# Patient Record
Sex: Male | Born: 1935
Health system: Southern US, Community
[De-identification: ages and names within clinical notes are randomized; demographics above are authoritative.]

## PROBLEM LIST (undated history)

## (undated) DIAGNOSIS — R011 Cardiac murmur, unspecified: Secondary | ICD-10-CM

## (undated) DIAGNOSIS — G2581 Restless legs syndrome: Secondary | ICD-10-CM

## (undated) DIAGNOSIS — H9190 Unspecified hearing loss, unspecified ear: Secondary | ICD-10-CM

## (undated) DIAGNOSIS — I219 Acute myocardial infarction, unspecified: Secondary | ICD-10-CM

## (undated) DIAGNOSIS — R350 Frequency of micturition: Secondary | ICD-10-CM

## (undated) DIAGNOSIS — R51 Headache: Secondary | ICD-10-CM

## (undated) DIAGNOSIS — R519 Headache, unspecified: Secondary | ICD-10-CM

## (undated) DIAGNOSIS — E785 Hyperlipidemia, unspecified: Secondary | ICD-10-CM

## (undated) HISTORY — DX: Unspecified hearing loss, unspecified ear: H91.90

## (undated) HISTORY — DX: Frequency of micturition: R35.0

## (undated) HISTORY — DX: Cardiac murmur, unspecified: R01.1

## (undated) HISTORY — DX: Acute myocardial infarction, unspecified: I21.9

## (undated) HISTORY — DX: Headache: R51

## (undated) HISTORY — DX: Restless legs syndrome: G25.81

## (undated) HISTORY — DX: Headache, unspecified: R51.9

---

## 2014-04-02 HISTORY — PX: CARPAL TUNNEL RELEASE: SHX101

## 2014-04-23 DIAGNOSIS — M542 Cervicalgia: Secondary | ICD-10-CM | POA: Diagnosis not present

## 2014-04-23 DIAGNOSIS — M5413 Radiculopathy, cervicothoracic region: Secondary | ICD-10-CM | POA: Diagnosis not present

## 2014-04-23 DIAGNOSIS — M546 Pain in thoracic spine: Secondary | ICD-10-CM | POA: Diagnosis not present

## 2014-04-23 DIAGNOSIS — M545 Low back pain: Secondary | ICD-10-CM | POA: Diagnosis not present

## 2014-04-30 DIAGNOSIS — M542 Cervicalgia: Secondary | ICD-10-CM | POA: Diagnosis not present

## 2014-04-30 DIAGNOSIS — M545 Low back pain: Secondary | ICD-10-CM | POA: Diagnosis not present

## 2014-04-30 DIAGNOSIS — M5413 Radiculopathy, cervicothoracic region: Secondary | ICD-10-CM | POA: Diagnosis not present

## 2014-04-30 DIAGNOSIS — M546 Pain in thoracic spine: Secondary | ICD-10-CM | POA: Diagnosis not present

## 2014-05-14 DIAGNOSIS — M545 Low back pain: Secondary | ICD-10-CM | POA: Diagnosis not present

## 2014-05-14 DIAGNOSIS — M542 Cervicalgia: Secondary | ICD-10-CM | POA: Diagnosis not present

## 2014-05-14 DIAGNOSIS — M546 Pain in thoracic spine: Secondary | ICD-10-CM | POA: Diagnosis not present

## 2014-05-14 DIAGNOSIS — M5413 Radiculopathy, cervicothoracic region: Secondary | ICD-10-CM | POA: Diagnosis not present

## 2014-06-10 DIAGNOSIS — M542 Cervicalgia: Secondary | ICD-10-CM | POA: Diagnosis not present

## 2014-06-10 DIAGNOSIS — M546 Pain in thoracic spine: Secondary | ICD-10-CM | POA: Diagnosis not present

## 2014-06-10 DIAGNOSIS — M545 Low back pain: Secondary | ICD-10-CM | POA: Diagnosis not present

## 2014-06-10 DIAGNOSIS — M5413 Radiculopathy, cervicothoracic region: Secondary | ICD-10-CM | POA: Diagnosis not present

## 2014-06-16 DIAGNOSIS — M545 Low back pain: Secondary | ICD-10-CM | POA: Diagnosis not present

## 2014-06-16 DIAGNOSIS — M546 Pain in thoracic spine: Secondary | ICD-10-CM | POA: Diagnosis not present

## 2014-06-16 DIAGNOSIS — M5413 Radiculopathy, cervicothoracic region: Secondary | ICD-10-CM | POA: Diagnosis not present

## 2014-06-16 DIAGNOSIS — M542 Cervicalgia: Secondary | ICD-10-CM | POA: Diagnosis not present

## 2014-06-21 DIAGNOSIS — S46011A Strain of muscle(s) and tendon(s) of the rotator cuff of right shoulder, initial encounter: Secondary | ICD-10-CM | POA: Diagnosis not present

## 2014-09-10 DIAGNOSIS — M9904 Segmental and somatic dysfunction of sacral region: Secondary | ICD-10-CM | POA: Diagnosis not present

## 2014-09-10 DIAGNOSIS — M9903 Segmental and somatic dysfunction of lumbar region: Secondary | ICD-10-CM | POA: Diagnosis not present

## 2014-09-10 DIAGNOSIS — S39012A Strain of muscle, fascia and tendon of lower back, initial encounter: Secondary | ICD-10-CM | POA: Diagnosis not present

## 2014-09-10 DIAGNOSIS — S338XXA Sprain of other parts of lumbar spine and pelvis, initial encounter: Secondary | ICD-10-CM | POA: Diagnosis not present

## 2014-09-13 DIAGNOSIS — M9903 Segmental and somatic dysfunction of lumbar region: Secondary | ICD-10-CM | POA: Diagnosis not present

## 2014-09-13 DIAGNOSIS — S338XXA Sprain of other parts of lumbar spine and pelvis, initial encounter: Secondary | ICD-10-CM | POA: Diagnosis not present

## 2014-09-13 DIAGNOSIS — S39012A Strain of muscle, fascia and tendon of lower back, initial encounter: Secondary | ICD-10-CM | POA: Diagnosis not present

## 2014-09-13 DIAGNOSIS — M9904 Segmental and somatic dysfunction of sacral region: Secondary | ICD-10-CM | POA: Diagnosis not present

## 2014-09-20 DIAGNOSIS — S338XXA Sprain of other parts of lumbar spine and pelvis, initial encounter: Secondary | ICD-10-CM | POA: Diagnosis not present

## 2014-09-20 DIAGNOSIS — M9904 Segmental and somatic dysfunction of sacral region: Secondary | ICD-10-CM | POA: Diagnosis not present

## 2014-09-20 DIAGNOSIS — S39012A Strain of muscle, fascia and tendon of lower back, initial encounter: Secondary | ICD-10-CM | POA: Diagnosis not present

## 2014-09-20 DIAGNOSIS — M9903 Segmental and somatic dysfunction of lumbar region: Secondary | ICD-10-CM | POA: Diagnosis not present

## 2015-01-12 DIAGNOSIS — Z23 Encounter for immunization: Secondary | ICD-10-CM | POA: Diagnosis not present

## 2015-02-03 DIAGNOSIS — M545 Low back pain: Secondary | ICD-10-CM | POA: Diagnosis not present

## 2015-02-03 DIAGNOSIS — M546 Pain in thoracic spine: Secondary | ICD-10-CM | POA: Diagnosis not present

## 2015-02-03 DIAGNOSIS — M5413 Radiculopathy, cervicothoracic region: Secondary | ICD-10-CM | POA: Diagnosis not present

## 2015-02-03 DIAGNOSIS — M9903 Segmental and somatic dysfunction of lumbar region: Secondary | ICD-10-CM | POA: Diagnosis not present

## 2015-02-03 DIAGNOSIS — M9901 Segmental and somatic dysfunction of cervical region: Secondary | ICD-10-CM | POA: Diagnosis not present

## 2015-02-03 DIAGNOSIS — M9902 Segmental and somatic dysfunction of thoracic region: Secondary | ICD-10-CM | POA: Diagnosis not present

## 2015-02-07 DIAGNOSIS — M545 Low back pain: Secondary | ICD-10-CM | POA: Diagnosis not present

## 2015-02-07 DIAGNOSIS — M5413 Radiculopathy, cervicothoracic region: Secondary | ICD-10-CM | POA: Diagnosis not present

## 2015-02-07 DIAGNOSIS — M9902 Segmental and somatic dysfunction of thoracic region: Secondary | ICD-10-CM | POA: Diagnosis not present

## 2015-02-07 DIAGNOSIS — M546 Pain in thoracic spine: Secondary | ICD-10-CM | POA: Diagnosis not present

## 2015-02-07 DIAGNOSIS — M9903 Segmental and somatic dysfunction of lumbar region: Secondary | ICD-10-CM | POA: Diagnosis not present

## 2015-02-07 DIAGNOSIS — M9901 Segmental and somatic dysfunction of cervical region: Secondary | ICD-10-CM | POA: Diagnosis not present

## 2015-02-09 DIAGNOSIS — M9903 Segmental and somatic dysfunction of lumbar region: Secondary | ICD-10-CM | POA: Diagnosis not present

## 2015-02-09 DIAGNOSIS — M545 Low back pain: Secondary | ICD-10-CM | POA: Diagnosis not present

## 2015-02-09 DIAGNOSIS — M5413 Radiculopathy, cervicothoracic region: Secondary | ICD-10-CM | POA: Diagnosis not present

## 2015-02-09 DIAGNOSIS — M9901 Segmental and somatic dysfunction of cervical region: Secondary | ICD-10-CM | POA: Diagnosis not present

## 2015-02-09 DIAGNOSIS — M546 Pain in thoracic spine: Secondary | ICD-10-CM | POA: Diagnosis not present

## 2015-02-09 DIAGNOSIS — M9902 Segmental and somatic dysfunction of thoracic region: Secondary | ICD-10-CM | POA: Diagnosis not present

## 2015-02-16 DIAGNOSIS — G5621 Lesion of ulnar nerve, right upper limb: Secondary | ICD-10-CM | POA: Diagnosis not present

## 2015-02-16 DIAGNOSIS — M25532 Pain in left wrist: Secondary | ICD-10-CM | POA: Diagnosis not present

## 2015-02-16 DIAGNOSIS — G5601 Carpal tunnel syndrome, right upper limb: Secondary | ICD-10-CM | POA: Diagnosis not present

## 2015-02-16 DIAGNOSIS — G5622 Lesion of ulnar nerve, left upper limb: Secondary | ICD-10-CM | POA: Diagnosis not present

## 2015-02-16 DIAGNOSIS — G5602 Carpal tunnel syndrome, left upper limb: Secondary | ICD-10-CM | POA: Diagnosis not present

## 2015-02-16 DIAGNOSIS — M25531 Pain in right wrist: Secondary | ICD-10-CM | POA: Diagnosis not present

## 2015-03-01 DIAGNOSIS — G5602 Carpal tunnel syndrome, left upper limb: Secondary | ICD-10-CM | POA: Diagnosis not present

## 2015-03-01 DIAGNOSIS — G5601 Carpal tunnel syndrome, right upper limb: Secondary | ICD-10-CM | POA: Diagnosis not present

## 2015-03-01 DIAGNOSIS — R202 Paresthesia of skin: Secondary | ICD-10-CM | POA: Diagnosis not present

## 2015-03-14 DIAGNOSIS — G5601 Carpal tunnel syndrome, right upper limb: Secondary | ICD-10-CM | POA: Diagnosis not present

## 2015-03-18 DIAGNOSIS — M79601 Pain in right arm: Secondary | ICD-10-CM | POA: Diagnosis not present

## 2015-03-18 DIAGNOSIS — Z01818 Encounter for other preprocedural examination: Secondary | ICD-10-CM | POA: Diagnosis not present

## 2015-03-29 DIAGNOSIS — G5601 Carpal tunnel syndrome, right upper limb: Secondary | ICD-10-CM | POA: Diagnosis not present

## 2015-03-29 DIAGNOSIS — M25531 Pain in right wrist: Secondary | ICD-10-CM | POA: Diagnosis not present

## 2015-04-11 DIAGNOSIS — G5601 Carpal tunnel syndrome, right upper limb: Secondary | ICD-10-CM | POA: Diagnosis not present

## 2015-05-06 DIAGNOSIS — J069 Acute upper respiratory infection, unspecified: Secondary | ICD-10-CM | POA: Diagnosis not present

## 2015-05-06 DIAGNOSIS — R197 Diarrhea, unspecified: Secondary | ICD-10-CM | POA: Diagnosis not present

## 2015-05-20 DIAGNOSIS — R197 Diarrhea, unspecified: Secondary | ICD-10-CM | POA: Diagnosis not present

## 2015-05-20 DIAGNOSIS — Z1211 Encounter for screening for malignant neoplasm of colon: Secondary | ICD-10-CM | POA: Diagnosis not present

## 2015-05-20 DIAGNOSIS — Z800 Family history of malignant neoplasm of digestive organs: Secondary | ICD-10-CM | POA: Diagnosis not present

## 2015-05-20 DIAGNOSIS — E782 Mixed hyperlipidemia: Secondary | ICD-10-CM | POA: Diagnosis not present

## 2015-05-20 DIAGNOSIS — I10 Essential (primary) hypertension: Secondary | ICD-10-CM | POA: Diagnosis not present

## 2015-05-20 DIAGNOSIS — J449 Chronic obstructive pulmonary disease, unspecified: Secondary | ICD-10-CM | POA: Diagnosis not present

## 2015-05-27 DIAGNOSIS — K5790 Diverticulosis of intestine, part unspecified, without perforation or abscess without bleeding: Secondary | ICD-10-CM | POA: Diagnosis not present

## 2015-05-27 DIAGNOSIS — I10 Essential (primary) hypertension: Secondary | ICD-10-CM | POA: Diagnosis not present

## 2015-05-27 DIAGNOSIS — Z800 Family history of malignant neoplasm of digestive organs: Secondary | ICD-10-CM | POA: Diagnosis not present

## 2015-05-27 DIAGNOSIS — K529 Noninfective gastroenteritis and colitis, unspecified: Secondary | ICD-10-CM | POA: Diagnosis not present

## 2015-05-27 DIAGNOSIS — K598 Other specified functional intestinal disorders: Secondary | ICD-10-CM | POA: Diagnosis not present

## 2015-05-27 DIAGNOSIS — K5730 Diverticulosis of large intestine without perforation or abscess without bleeding: Secondary | ICD-10-CM | POA: Diagnosis not present

## 2015-06-02 DIAGNOSIS — J069 Acute upper respiratory infection, unspecified: Secondary | ICD-10-CM | POA: Diagnosis not present

## 2015-06-02 DIAGNOSIS — T7840XA Allergy, unspecified, initial encounter: Secondary | ICD-10-CM | POA: Diagnosis not present

## 2015-06-03 DIAGNOSIS — Z800 Family history of malignant neoplasm of digestive organs: Secondary | ICD-10-CM | POA: Diagnosis not present

## 2015-06-03 DIAGNOSIS — R197 Diarrhea, unspecified: Secondary | ICD-10-CM | POA: Diagnosis not present

## 2015-06-03 DIAGNOSIS — E782 Mixed hyperlipidemia: Secondary | ICD-10-CM | POA: Diagnosis not present

## 2015-06-03 DIAGNOSIS — I10 Essential (primary) hypertension: Secondary | ICD-10-CM | POA: Diagnosis not present

## 2015-06-03 DIAGNOSIS — J449 Chronic obstructive pulmonary disease, unspecified: Secondary | ICD-10-CM | POA: Diagnosis not present

## 2015-07-27 DIAGNOSIS — M9903 Segmental and somatic dysfunction of lumbar region: Secondary | ICD-10-CM | POA: Diagnosis not present

## 2015-07-27 DIAGNOSIS — M9902 Segmental and somatic dysfunction of thoracic region: Secondary | ICD-10-CM | POA: Diagnosis not present

## 2015-07-27 DIAGNOSIS — M9901 Segmental and somatic dysfunction of cervical region: Secondary | ICD-10-CM | POA: Diagnosis not present

## 2015-07-27 DIAGNOSIS — M546 Pain in thoracic spine: Secondary | ICD-10-CM | POA: Diagnosis not present

## 2015-07-27 DIAGNOSIS — M5413 Radiculopathy, cervicothoracic region: Secondary | ICD-10-CM | POA: Diagnosis not present

## 2015-07-27 DIAGNOSIS — M545 Low back pain: Secondary | ICD-10-CM | POA: Diagnosis not present

## 2015-09-07 DIAGNOSIS — S338XXA Sprain of other parts of lumbar spine and pelvis, initial encounter: Secondary | ICD-10-CM | POA: Diagnosis not present

## 2015-09-07 DIAGNOSIS — M9904 Segmental and somatic dysfunction of sacral region: Secondary | ICD-10-CM | POA: Diagnosis not present

## 2015-09-07 DIAGNOSIS — M9903 Segmental and somatic dysfunction of lumbar region: Secondary | ICD-10-CM | POA: Diagnosis not present

## 2015-09-07 DIAGNOSIS — S39012A Strain of muscle, fascia and tendon of lower back, initial encounter: Secondary | ICD-10-CM | POA: Diagnosis not present

## 2015-09-09 DIAGNOSIS — S39012A Strain of muscle, fascia and tendon of lower back, initial encounter: Secondary | ICD-10-CM | POA: Diagnosis not present

## 2015-09-09 DIAGNOSIS — M9903 Segmental and somatic dysfunction of lumbar region: Secondary | ICD-10-CM | POA: Diagnosis not present

## 2015-09-09 DIAGNOSIS — M9904 Segmental and somatic dysfunction of sacral region: Secondary | ICD-10-CM | POA: Diagnosis not present

## 2015-09-09 DIAGNOSIS — S338XXA Sprain of other parts of lumbar spine and pelvis, initial encounter: Secondary | ICD-10-CM | POA: Diagnosis not present

## 2015-09-12 DIAGNOSIS — M9904 Segmental and somatic dysfunction of sacral region: Secondary | ICD-10-CM | POA: Diagnosis not present

## 2015-09-12 DIAGNOSIS — M9903 Segmental and somatic dysfunction of lumbar region: Secondary | ICD-10-CM | POA: Diagnosis not present

## 2015-09-12 DIAGNOSIS — S338XXA Sprain of other parts of lumbar spine and pelvis, initial encounter: Secondary | ICD-10-CM | POA: Diagnosis not present

## 2015-09-12 DIAGNOSIS — S39012A Strain of muscle, fascia and tendon of lower back, initial encounter: Secondary | ICD-10-CM | POA: Diagnosis not present

## 2016-01-11 DIAGNOSIS — R011 Cardiac murmur, unspecified: Secondary | ICD-10-CM | POA: Diagnosis not present

## 2016-01-11 DIAGNOSIS — G2589 Other specified extrapyramidal and movement disorders: Secondary | ICD-10-CM | POA: Diagnosis not present

## 2016-01-11 DIAGNOSIS — E7800 Pure hypercholesterolemia, unspecified: Secondary | ICD-10-CM | POA: Diagnosis not present

## 2016-01-11 DIAGNOSIS — G2581 Restless legs syndrome: Secondary | ICD-10-CM | POA: Diagnosis not present

## 2016-01-11 DIAGNOSIS — Z13220 Encounter for screening for lipoid disorders: Secondary | ICD-10-CM | POA: Diagnosis not present

## 2016-01-11 DIAGNOSIS — Z136 Encounter for screening for cardiovascular disorders: Secondary | ICD-10-CM | POA: Diagnosis not present

## 2016-01-11 DIAGNOSIS — Z23 Encounter for immunization: Secondary | ICD-10-CM | POA: Diagnosis not present

## 2016-01-11 DIAGNOSIS — Z8639 Personal history of other endocrine, nutritional and metabolic disease: Secondary | ICD-10-CM | POA: Diagnosis not present

## 2016-01-11 DIAGNOSIS — R7301 Impaired fasting glucose: Secondary | ICD-10-CM | POA: Diagnosis not present

## 2016-01-23 DIAGNOSIS — M5413 Radiculopathy, cervicothoracic region: Secondary | ICD-10-CM | POA: Diagnosis not present

## 2016-01-23 DIAGNOSIS — M9903 Segmental and somatic dysfunction of lumbar region: Secondary | ICD-10-CM | POA: Diagnosis not present

## 2016-01-23 DIAGNOSIS — M546 Pain in thoracic spine: Secondary | ICD-10-CM | POA: Diagnosis not present

## 2016-01-23 DIAGNOSIS — M9901 Segmental and somatic dysfunction of cervical region: Secondary | ICD-10-CM | POA: Diagnosis not present

## 2016-01-23 DIAGNOSIS — M9902 Segmental and somatic dysfunction of thoracic region: Secondary | ICD-10-CM | POA: Diagnosis not present

## 2016-01-23 DIAGNOSIS — M545 Low back pain: Secondary | ICD-10-CM | POA: Diagnosis not present

## 2016-01-24 DIAGNOSIS — M546 Pain in thoracic spine: Secondary | ICD-10-CM | POA: Diagnosis not present

## 2016-01-24 DIAGNOSIS — M9901 Segmental and somatic dysfunction of cervical region: Secondary | ICD-10-CM | POA: Diagnosis not present

## 2016-01-24 DIAGNOSIS — M545 Low back pain: Secondary | ICD-10-CM | POA: Diagnosis not present

## 2016-01-24 DIAGNOSIS — M9903 Segmental and somatic dysfunction of lumbar region: Secondary | ICD-10-CM | POA: Diagnosis not present

## 2016-01-24 DIAGNOSIS — M9902 Segmental and somatic dysfunction of thoracic region: Secondary | ICD-10-CM | POA: Diagnosis not present

## 2016-01-24 DIAGNOSIS — M5413 Radiculopathy, cervicothoracic region: Secondary | ICD-10-CM | POA: Diagnosis not present

## 2016-01-30 DIAGNOSIS — R011 Cardiac murmur, unspecified: Secondary | ICD-10-CM | POA: Diagnosis not present

## 2016-01-31 ENCOUNTER — Encounter (HOSPITAL_COMMUNITY): Payer: Self-pay | Admitting: Emergency Medicine

## 2016-01-31 ENCOUNTER — Emergency Department (HOSPITAL_COMMUNITY)
Admission: EM | Admit: 2016-01-31 | Discharge: 2016-02-01 | Disposition: A | Discharge status: Home / Self Care | Payer: Medicare Other | Attending: Emergency Medicine | Admitting: Emergency Medicine

## 2016-01-31 DIAGNOSIS — G839 Paralytic syndrome, unspecified: Secondary | ICD-10-CM | POA: Diagnosis not present

## 2016-01-31 DIAGNOSIS — E782 Mixed hyperlipidemia: Secondary | ICD-10-CM | POA: Insufficient documentation

## 2016-01-31 DIAGNOSIS — E860 Dehydration: Secondary | ICD-10-CM | POA: Diagnosis not present

## 2016-01-31 DIAGNOSIS — M6281 Muscle weakness (generalized): Secondary | ICD-10-CM | POA: Diagnosis not present

## 2016-01-31 DIAGNOSIS — R918 Other nonspecific abnormal finding of lung field: Secondary | ICD-10-CM | POA: Diagnosis not present

## 2016-01-31 DIAGNOSIS — R531 Weakness: Secondary | ICD-10-CM | POA: Insufficient documentation

## 2016-01-31 DIAGNOSIS — G43909 Migraine, unspecified, not intractable, without status migrainosus: Secondary | ICD-10-CM | POA: Insufficient documentation

## 2016-01-31 DIAGNOSIS — F17210 Nicotine dependence, cigarettes, uncomplicated: Secondary | ICD-10-CM | POA: Insufficient documentation

## 2016-01-31 DIAGNOSIS — E785 Hyperlipidemia, unspecified: Secondary | ICD-10-CM | POA: Diagnosis not present

## 2016-01-31 DIAGNOSIS — I6789 Other cerebrovascular disease: Secondary | ICD-10-CM | POA: Diagnosis not present

## 2016-01-31 DIAGNOSIS — I6782 Cerebral ischemia: Secondary | ICD-10-CM | POA: Diagnosis not present

## 2016-01-31 HISTORY — DX: Hyperlipidemia, unspecified: E78.5

## 2016-01-31 LAB — CBG MONITORING, ED: Glucose-Capillary: 120 mg/dL — ABNORMAL HIGH (ref 65–99)

## 2016-01-31 NOTE — ED Triage Notes (Signed)
Pt BIB [facility] from home.  Presents today with significant weakness x 3 days after his PCP found his cholesterol was extremely elevated and increased his dose of simvastatin from 20mg  to 40mg .  3 months ago, pt had onset of leg weakness and pain, but was able to perform ADLs at that time; decreased level of activity since moving here as well. Migraines started 3 weeks ago, no hx of HA.  EMS noted weak L grip strength, pt c/o pain in his joints, L shoulder joint pain. VSS per EMS 140/72, HE0, 98% oxygen on RA.

## 2016-02-01 ENCOUNTER — Emergency Department (HOSPITAL_COMMUNITY): Payer: Medicare Other

## 2016-02-01 DIAGNOSIS — R531 Weakness: Secondary | ICD-10-CM | POA: Diagnosis not present

## 2016-02-01 DIAGNOSIS — I6782 Cerebral ischemia: Secondary | ICD-10-CM | POA: Diagnosis not present

## 2016-02-01 DIAGNOSIS — R918 Other nonspecific abnormal finding of lung field: Secondary | ICD-10-CM | POA: Diagnosis not present

## 2016-02-01 LAB — COMPREHENSIVE METABOLIC PANEL
ALK PHOS: 54 U/L (ref 38–126)
ALT: 15 U/L — AB (ref 17–63)
AST: 15 U/L (ref 15–41)
Albumin: 3.1 g/dL — ABNORMAL LOW (ref 3.5–5.0)
Anion gap: 10 (ref 5–15)
BILIRUBIN TOTAL: 0.5 mg/dL (ref 0.3–1.2)
BUN: 19 mg/dL (ref 6–20)
CALCIUM: 9.4 mg/dL (ref 8.9–10.3)
CO2: 24 mmol/L (ref 22–32)
CREATININE: 1.05 mg/dL (ref 0.61–1.24)
Chloride: 101 mmol/L (ref 101–111)
Glucose, Bld: 95 mg/dL (ref 65–99)
Potassium: 3.9 mmol/L (ref 3.5–5.1)
Sodium: 135 mmol/L (ref 135–145)
TOTAL PROTEIN: 6.1 g/dL — AB (ref 6.5–8.1)

## 2016-02-01 LAB — URINALYSIS, ROUTINE W REFLEX MICROSCOPIC
BILIRUBIN URINE: NEGATIVE
GLUCOSE, UA: NEGATIVE mg/dL
HGB URINE DIPSTICK: NEGATIVE
Ketones, ur: NEGATIVE mg/dL
Leukocytes, UA: NEGATIVE
Nitrite: NEGATIVE
PROTEIN: NEGATIVE mg/dL
Specific Gravity, Urine: 1.007 (ref 1.005–1.030)
pH: 6 (ref 5.0–8.0)

## 2016-02-01 LAB — CBC WITH DIFFERENTIAL/PLATELET
Basophils Absolute: 0 10*3/uL (ref 0.0–0.1)
Basophils Relative: 0 %
EOS PCT: 3 %
Eosinophils Absolute: 0.3 10*3/uL (ref 0.0–0.7)
HEMATOCRIT: 36 % — AB (ref 39.0–52.0)
HEMOGLOBIN: 12.2 g/dL — AB (ref 13.0–17.0)
LYMPHS ABS: 2.4 10*3/uL (ref 0.7–4.0)
LYMPHS PCT: 23 %
MCH: 30.4 pg (ref 26.0–34.0)
MCHC: 33.9 g/dL (ref 30.0–36.0)
MCV: 89.8 fL (ref 78.0–100.0)
Monocytes Absolute: 0.7 10*3/uL (ref 0.1–1.0)
Monocytes Relative: 7 %
NEUTROS ABS: 7.2 10*3/uL (ref 1.7–7.7)
Neutrophils Relative %: 67 %
PLATELETS: 173 10*3/uL (ref 150–400)
RBC: 4.01 MIL/uL — AB (ref 4.22–5.81)
RDW: 13.3 % (ref 11.5–15.5)
WBC: 10.6 10*3/uL — AB (ref 4.0–10.5)

## 2016-02-01 LAB — CK: CK TOTAL: 22 U/L — AB (ref 49–397)

## 2016-02-01 MED ORDER — HYDROCODONE-ACETAMINOPHEN 5-325 MG PO TABS: 1.0000 | ORAL_TABLET | Freq: Four times a day (QID) | ORAL | 0 refills | Status: DC | PRN

## 2016-02-01 MED ORDER — SODIUM CHLORIDE 0.9 % IV BOLUS (SEPSIS)
1000.0000 mL | Freq: Once | INTRAVENOUS | Status: AC
  Administered 2016-02-01: 1000 mL via INTRAVENOUS

## 2016-02-01 NOTE — ED Notes (Signed)
Patient transported to CT 

## 2016-02-01 NOTE — ED Notes (Signed)
Pt ambulated without difficulty

## 2016-02-01 NOTE — ED Provider Notes (Signed)
[facility] DEPT Provider Note   CSN: [id number] Arrival date & time: 01/31/16  2318  By signing my name below, I, Evelene Croon, attest that this documentation has been prepared under the direction and in the presence of Jola Schmidt, MD . Electronically Signed: Evelene Croon, Scribe. 02/01/2016. 1:02 AM.  History   Chief Complaint Chief Complaint  Patient presents with  . Weakness  . Leg Pain     The history is provided by the patient and the spouse. No language interpreter was used.    HPI Comments:  Ronald Pacheco is a 80 y.o. male brought in by ambulance who presents to the Emergency Department complaining of persistent moderate pain in the LLE x 1 month. He reports the majority of his pain is located in the left calf region with some pain in the left ankle. He states ~ 1.5 weeks ago he needed help getting out of the car secondary to the pain and has been shuffling when he walks. Pt states since the onset of this pain he has slowly been having more difficulty with his activities of daily living. He also complains of pain to the left shoulder and hand x ~ 1 week. He notes trouble with his grip using the left hand because of pain. Pt has also been experiencing intermittent HAs x 3-4 weeks; states he does not have a h/o reoccurring HAs prior to symptom onset. No alleviating factors noted. Wife notes decreased appetite and 6-8 pound weight loss in the last month. Pt denies abdominal pain, back pain,  nausea, and vomiting. Pt is a current smoker. No h/o CVA.  Pt recently relocated to [facility] from [place] and has not established primary care here.   Past Medical History:  Diagnosis Date  . Hyperlipidemia     Patient Active Problem List   Diagnosis Date Noted  . Hyperlipidemia     Past Surgical History:  Procedure Laterality Date  . CARPAL TUNNEL RELEASE Right 2016     Home Medications    Prior to Admission medications   Medication Sig Start Date End Date Taking?  Authorizing Provider  simvastatin (ZOCOR) 40 MG tablet Take 40 mg by mouth daily.   Yes Historical Provider, MD  tamsulosin (FLOMAX) 0.4 MG CAPS capsule Take 0.4 mg by mouth.   Yes Historical Provider, MD    Family History No family history on file.  Social History Social History  Substance Use Topics  . Smoking status: Current Every Day Smoker    Packs/day: 1.00    Years: 8.00    Types: Cigarettes  . Smokeless tobacco: Never Used     Comment: Quit for 23 years, started again  . Alcohol use Yes     Comment: occasional     Allergies   Review of patient's allergies indicates no known allergies.   Review of Systems Review of Systems 10 systems reviewed and all are negative for acute change except as noted in the HPI.  Physical Exam Updated Vital Signs BP 121/65   Pulse 77   Temp 98.3 F (36.8 C) (Oral)   Resp 24   Ht 5\' 6"  (1.676 m)   Wt 140 lb (63.5 kg)   SpO2 99%   BMI 22.60 kg/m   Physical Exam  Constitutional: He is oriented to person, place, and time. He appears well-developed and well-nourished.  HENT:  Head: Normocephalic and atraumatic.  Eyes: EOM are normal. Pupils are equal, round, and reactive to light.  Neck: Normal range of motion.  Cardiovascular:  Normal rate, regular rhythm, normal heart sounds and intact distal pulses.   Pulmonary/Chest: Effort normal and breath sounds normal. No respiratory distress.  Abdominal: Soft. He exhibits no distension. There is no tenderness.  Musculoskeletal: Normal range of motion.  Neurological: He is alert and oriented to person, place, and time.  5/5 strength in major muscle groups of  bilateral lower extremities. Speech normal. No facial asymetry. Nml strength in RUE major muscle groups; mild weakness left upper  extremity with abnormal finger to nose in left upper extremity  Skin: Skin is warm and dry.  Psychiatric: He has a normal mood and affect. Judgment normal.  Nursing note and vitals reviewed.    ED  Treatments / Results  DIAGNOSTIC STUDIES:  Oxygen Saturation is 98% on RA, normal by my interpretation.    COORDINATION OF CARE:  12:33 AM Discussed treatment plan with pt at bedside and pt agreed to plan.  Labs (all labs ordered are listed, but only abnormal results are displayed) Labs Reviewed  CBC WITH DIFFERENTIAL/PLATELET - Abnormal; Notable for the following:       Result Value   WBC 10.6 (*)    RBC 4.01 (*)    Hemoglobin 12.2 (*)    HCT 36.0 (*)    All other components within normal limits  COMPREHENSIVE METABOLIC PANEL - Abnormal; Notable for the following:    Total Protein 6.1 (*)    Albumin 3.1 (*)    ALT 15 (*)    All other components within normal limits  CK - Abnormal; Notable for the following:    Total CK 22 (*)    All other components within normal limits  CBG MONITORING, ED - Abnormal; Notable for the following:    Glucose-Capillary 120 (*)    All other components within normal limits  URINALYSIS, ROUTINE W REFLEX MICROSCOPIC (NOT AT [facility])  TSH    EKG  EKG Interpretation  Date/Time:  Tuesday January 31 2016 23:19:55 EDT Ventricular Rate:  83 PR Interval:    QRS Duration: 70 QT Interval:  370 QTC Calculation: 435 R Axis:   68 Text Interpretation:  Sinus rhythm nonspecific st changes No old tracing to compare Confirmed by Cheyanna Strick  MD, Lennette Bihari ([id number]) on 02/01/2016 2:25:57 AM       Radiology Dg Chest 2 View  Result Date: 02/01/2016 CLINICAL DATA:  Left-sided weakness, onset tonight EXAM: CHEST  2 VIEW COMPARISON:  None. FINDINGS: Mild hyperinflation. The lungs are clear. The pulmonary vasculature is normal. Hilar and mediastinal contours are normal. Heart size is normal. No pleural effusion. IMPRESSION: Mild hyperinflation. Electronically Signed   By: Andreas Newport M.D.   On: 02/01/2016 01:26   Ct Head Wo Contrast  Result Date: 02/01/2016 CLINICAL DATA:  Acute onset of generalized weakness and hypercholesterolemia. Initial encounter. EXAM: CT HEAD  WITHOUT CONTRAST TECHNIQUE: Contiguous axial images were obtained from the base of the skull through the vertex without intravenous contrast. COMPARISON:  None. FINDINGS: Brain: No evidence of acute infarction, hemorrhage, hydrocephalus, extra-axial collection or mass lesion/mass effect. Prominence of the ventricles and sulci suggests mild cortical volume loss. Diffuse periventricular and subcortical white matter change likely reflects small vessel ischemic microangiopathy. The brainstem and fourth ventricle are within normal limits. The basal ganglia are unremarkable in appearance. The cerebral hemispheres demonstrate grossly normal gray-white differentiation. No mass effect or midline shift is seen. Vascular: No hyperdense vessel or unexpected calcification. Skull: The liver is unremarkable in appearance. The gallbladder is unremarkable in appearance. The common bile  duct remains normal in caliber. Sinuses/Orbits: The orbits are within normal limits. The paranasal sinuses and mastoid air cells are well-aerated. Other: No significant soft tissue abnormalities are seen. IMPRESSION: 1. No acute intracranial pathology seen on CT. 2. Mild cortical volume loss and diffuse small vessel ischemic microangiopathy. Electronically Signed   By: Garald Balding M.D.   On: 02/01/2016 01:02    Procedures Procedures (including critical care time)  Medications Ordered in ED Medications  sodium chloride 0.9 % bolus 1,000 mL (1,000 mLs Intravenous New Bag/Given 02/01/16 0227)     Initial Impression / Assessment and Plan / ED Course  I have reviewed the triage vital signs and the nursing notes.  Pertinent labs & imaging results that were available during my care of the patient were reviewed by me and considered in my medical decision making (see chart for details).  Clinical Course    4:11 AM Feels better this time.  Patient is fully ambulatory in the emergency department.  I personally walked with abdominal  hallway.  He is able to sit on the side of the bed and stand up on his own.  He is able to walk without difficulty and is able to get himself back into the bed.  Discharge home in good condition with primary care and outpatient neurology follow-up.  He understands return to the ER for new or worsening symptoms  Final Clinical Impressions(s) / ED Diagnoses   Final diagnoses:  Hyperlipidemia, unspecified hyperlipidemia type    New Prescriptions New Prescriptions   No medications on file   I personally performed the services described in this documentation, which was scribed in my presence. The recorded information has been reviewed and is accurate.        Jola Schmidt, MD 02/01/16 [phone]

## 2016-02-02 ENCOUNTER — Ambulatory Visit (INDEPENDENT_AMBULATORY_CARE_PROVIDER_SITE_OTHER): Payer: Medicare Other | Admitting: Diagnostic Neuroimaging

## 2016-02-02 ENCOUNTER — Ambulatory Visit (INDEPENDENT_AMBULATORY_CARE_PROVIDER_SITE_OTHER): Payer: Medicare Other | Admitting: Neurology

## 2016-02-02 ENCOUNTER — Encounter (INDEPENDENT_AMBULATORY_CARE_PROVIDER_SITE_OTHER): Payer: Self-pay | Admitting: Diagnostic Neuroimaging

## 2016-02-02 ENCOUNTER — Encounter: Payer: Self-pay | Admitting: Neurology

## 2016-02-02 VITALS — BP 131/70 | HR 88 | Ht 66.0 in | Wt 135.0 lb

## 2016-02-02 DIAGNOSIS — M6281 Muscle weakness (generalized): Secondary | ICD-10-CM

## 2016-02-02 DIAGNOSIS — M6289 Other specified disorders of muscle: Secondary | ICD-10-CM | POA: Insufficient documentation

## 2016-02-02 DIAGNOSIS — E782 Mixed hyperlipidemia: Secondary | ICD-10-CM | POA: Diagnosis not present

## 2016-02-02 DIAGNOSIS — F17200 Nicotine dependence, unspecified, uncomplicated: Secondary | ICD-10-CM

## 2016-02-02 DIAGNOSIS — I10 Essential (primary) hypertension: Secondary | ICD-10-CM

## 2016-02-02 DIAGNOSIS — Z0289 Encounter for other administrative examinations: Secondary | ICD-10-CM

## 2016-02-02 NOTE — Procedures (Signed)
   [facility]  NCS (NERVE CONDUCTION STUDY) WITH EMG (ELECTROMYOGRAPHY) REPORT   STUDY DATE: 02/02/16 PATIENT NAME: Ronald Pacheco DOB: [birth year] MRN: [id number]  ORDERING CLINICIAN: Rosalin Hawking, MD PhD   TECHNOLOGIST: Laretta Alstrom  ELECTROMYOGRAPHER: Earlean Polka. Penumalli, MD  CLINICAL INFORMATION: 80 year old male here for evaluation of muscle weakness. Patient reports left leg weakness since July 2017. Also with bilateral upper and lower extremity weakness. Difficulty with climbing steps, rolling over in bed, standing up. History of bilateral carpal tunnel syndrome status post surgery on the right side.  FINDINGS: NERVE CONDUCTION STUDY: Left median motor response has prolonged distal latency (4.6 ms), decreased amplitude, normal conduction velocity and normal F-wave latency.  Left ulnar, left peroneal and left tibial motor responses and F wave latencies are normal.  Left H reflex responses normal.  Left median sensory response has prolonged latency and normal amplitude.  Left ulnar and left peroneal sensory responses are normal.   NEEDLE ELECTROMYOGRAPHY: Needle examination of left lower extremity iliopsoas, gluteus medius, vastus medialis, tibialis interior, gastrocnemius and left L4-5 paraspinal muscles is normal.   IMPRESSION:  Mildly abnormal study demonstrate: 1. Mild left median neuropathy at the wrist consistent with mild carpal tunnel syndrome. 2. No definite electrodiagnostic evidence of underlying large fiber neuropathy, myopathy or lumbar radiculopathy at this time.     INTERPRETING PHYSICIAN:  Penni Bombard, MD Certified in Neurology, Neurophysiology and Neuroimaging  [facility] [address], [place], [place] [address] [phone]

## 2016-02-02 NOTE — Patient Instructions (Signed)
-   stop taking lipitor - you likely to have muscle disease and we need further work up on that - will check blood work today - will need to do nerve conduction study - will need refer to PT/OT - established primary care physician - follow up in one month with me or Dr. Krista Blue whoever available.

## 2016-02-02 NOTE — Progress Notes (Signed)
NEUROLOGY CLINIC NEW PATIENT NOTE  NAME: Ronald Pacheco DOB: 01/26/1936  I saw Ronald Pacheco as a new patient in the clinic today regarding  Chief Complaint  Patient presents with  . Referral    Referral from ED for generalized weakness on 01/31/2016. Pt stated had headaches in the past Pt had a echocardiogram  .  HPI: Ronald Pacheco is a 80 y.o. male with PMH of HLD who presents as a new patient for muscle weakness for 3 months.  He was in good health before July this year when he moved from [place] to [place]. In August he felt some bilateral leg weakness associated with calf and ankle pain. He did not seek medical attention as he thought this could be related to recent home moving. The pain was severe but lasted about 2-3 weeks and resolved. However, since then, the muscle weakness getting worse, gradually he has difficulty getting up from chair, slow climbing stairs, can not get out of the car, shuffling on walking. Weakness also involved to UEs with shoulder weakness, difficult dress/undress himself, at night can not rolling over during sleep. Left hand weaker grip than right hand. Still has some mild pain at left shoulder, elbow and left hand but not severe. He also complains of HA in August and September but now resolved. He deneis any hx of HA or migraine but the HA was severe for him. HA more b/l frontal and achy pain. Symptoms seems slight better in the morning than evening, however, no fluctuation with rest or action. No diplopia or eye lid droop, or swallow difficulty.   He went to see urgent care at [facility] on 01/11/16, checked LDL 149, TG 198, A1C 6.0, Cre 1.2, normal Mg, MMA and homocystein. His lipitor increased from 20 to 40mg . Within 3 days, his symptoms significantly worsened and wife has to stop lipitor. He has been on lipitor 20mg  for 4-5 years and no such symptoms. After stopping lipitor symptoms seems better some.   He went to ED on 01/31/16 for persistent muscle  weakenss and pain to the left shoulder and hand, trouble with his grip using the left hand because of pain. He denies abdominal pain, back pain,  nausea, and vomiting. Check CK was low at 22. He was referred here for further evaluation.   Pt is a current smoker. Hx of HLD on lipitor, and HTN on lisinopril and HCTZ. BP today 131/70. Has RLE on riquip 0.25mg  at night. He has B12 147mcg daily for supplement. Before moving to [place], he was active, ride bikes without difficulty. He has not established primary care here.   Past Medical History:  Diagnosis Date  . Headache   . Hearing loss   . Heart murmur   . Hyperlipidemia   . RLS (restless legs syndrome)   . Urinary frequency    Past Surgical History:  Procedure Laterality Date  . CARPAL TUNNEL RELEASE Right 2016   History reviewed. No pertinent family history. Current Outpatient Prescriptions  Medication Sig Dispense Refill  . Cholecalciferol (D3-1000) 1000 units tablet Take 1,000 Units by mouth daily.    Marland Kitchen EXTRA STRENGTH ACETAMINOPHEN PO Take by mouth.    . Glucosamine HCl (GLUCOSAMINE PO) Take by mouth.    Marland Kitchen HYDROcodone-acetaminophen (NORCO/VICODIN) 5-325 MG tablet Take 1 tablet by mouth every 6 (six) hours as needed for moderate pain. 10 tablet 0  . lisinopril-hydrochlorothiazide (PRINZIDE,ZESTORETIC) 10-12.5 MG tablet Take 1 tablet by mouth daily.    . Magnesium 500 MG  CAPS Take by mouth.    . Multiple Vitamins-Minerals (MULTIVITAL PLATINUM SILVER PO) Take by mouth.    . simvastatin (ZOCOR) 20 MG tablet     . tamsulosin (FLOMAX) 0.4 MG CAPS capsule Take 0.4 mg by mouth.    . traZODone (DESYREL) 50 MG tablet at bedtime.     . vitamin B-12 (CYANOCOBALAMIN) 1000 MCG tablet Take 1,000 mcg by mouth daily.    Marland Kitchen rOPINIRole (REQUIP) 0.25 MG tablet      No current facility-administered medications for this visit.    No Known Allergies Social History   Social History  . Marital status: Married    Spouse name: N/A  . Number of  children: N/A  . Years of education: N/A   Occupational History  . Not on file.   Social History Main Topics  . Smoking status: Current Every Day Smoker    Packs/day: 0.50    Years: 8.00    Types: Cigarettes  . Smokeless tobacco: Never Used     Comment: Quit for 23 years, started again  . Alcohol use 0.6 oz/week    1 Cans of beer per week     Comment: occasional  . Drug use: No  . Sexual activity: Not on file   Other Topics Concern  . Not on file   Social History Narrative  . No narrative on file    Review of Systems Full 14 system review of systems performed and notable only for those listed, all others are neg:  Constitutional:  Weight loss Cardiovascular:  Ear/Nose/Throat:  Hearing loss Skin: moles Eyes:   Respiratory:  Cough, snoring Gastroitestinal:   Genitourinary: urinary problems Hematology/Lymphatic:   Endocrine:  Musculoskeletal:  Joint pain, aching muscles Allergy/Immunology:   Neurological:  HA, numbness, weakness Psychiatric: anxiety, decreased energy Sleep: snoring, restless leg   Physical Exam  Vitals:   02/02/16 0857  BP: 131/70  Pulse: 88    General - Well nourished, well developed, in no apparent distress.  Ophthalmologic - Sharp disc margins OU.  Cardiovascular - Regular rate and rhythm with no murmur.   Neck - supple, no nuchal rigidity.  Mental Status -  Level of arousal and orientation to time, place, and person were intact. Language including expression, naming, repetition, comprehension, reading, and writing was assessed and found intact. Fund of Knowledge was assessed and was intact.  Cranial Nerves II - XII - II - Visual field intact OU. III, IV, VI - Extraocular movements intact. V - Facial sensation intact bilaterally. VII - Facial movement intact bilaterally. VIII - Hearing & vestibular intact bilaterally. X - Palate elevates symmetrically. XI - Chin turning & shoulder shrug intact bilaterally. XII - Tongue  protrusion intact.  Motor Strength - The patient's strength was more proximal weakness, with b/l deltoid 3+/5, bicep 4/5, tricep 5-/5, hand grip 4+/5 on the right and 4-/5 on the left. B/l iliopsoas 4/5, but otherwise LEs 5/5. Bulk was normal and fasciculations were absent.   Motor Tone - Muscle tone was assessed at the neck and appendages and was normal.  Reflexes - The patient's reflexes were 1+ BUEs, 2+ patellar b/l, 1+ ankle reflex b/l and he had no pathological reflexes.  Sensory - Light touch, temperature/pinprick were assessed and were normal.    Coordination - The patient had normal movements in the hands and feet with no ataxia or dysmetria.  Tremor was absent.  Gait and Station - small stride and mild shuffling, difficult getting out of chair.   Imaging  I have personally reviewed the radiological images below and agree with the radiology interpretations.  Ct Head Wo Contrast 02/01/2016 IMPRESSION: 1. No acute intracranial pathology seen on CT. 2. Mild cortical volume loss and diffuse small vessel ischemic microangiopathy.   Lab Review Component     Latest Ref Rng & Units 02/01/2016  CK Total     49 - 397 U/L 22 (L)   LDL 149, TG 198, A1C 6.0, Cre 1.2, normal Mg, MMA and homocystein    Assessment and Plan:   In summary, Ronald Pacheco is a 80 y.o. male with PMH of HTN, HLD, smoker presents with  muscle weakness for 3 months. The muscle weakness pattern more proximal, both UEs and LEs, gradual onset, concerning for myopathy. CK not elevated, not consistent with myositis. His symptoms worsened with increase lipitor dose, however, he was on lipitor for 5 years, no symptoms, also CK low, not consistent with statin induced myopathy. For his age, will need to rule out inclusion body myositis. Symptoms and exam not consistent with MG or ALS. Will do EMG/NCS, check aldolase, TSH, acetylcholine Abx, and PT/OT. Would like Dr. Krista Blue to see him in one month for second opinion. Further work up  pending EMG/NCS.  - stop taking lipitor - will check aldolase, TSH, acetylcholine Abx - will do EMG/NCS - refer to PT/OT - established primary care physician - follow up in one month with Dr. Krista Blue for second opinion.   Thank you very much for the opportunity to participate in the care of this patient.  Please do not hesitate to call if any questions or concerns arise.  Orders Placed This Encounter  Procedures  . Aldolase  . Acetylcholine Receptor Ab, All  . TSH + free T4  . Ambulatory referral to Physical Therapy    Referral Priority:   Routine    Referral Type:   Physical Medicine    Referral Reason:   Specialty Services Required    Requested Specialty:   Physical Therapy    Number of Visits Requested:   1  . Ambulatory referral to Occupational Therapy    Referral Priority:   Routine    Referral Type:   Occupational Therapy    Referral Reason:   Specialty Services Required    Requested Specialty:   Occupational Therapy    Number of Visits Requested:   1  . NCV with EMG(electromyography)    Standing Status:   Future    Standing Expiration Date:   02/01/2017    Scheduling Instructions:     Please schedule ASAP. Concerning for muscle disease.    Meds ordered this encounter  Medications  . DISCONTD: CHANTIX STARTING MONTH PAK 0.5 MG X 11 & 1 MG X 42 tablet  . traZODone (DESYREL) 50 MG tablet    Sig: at bedtime.   Marland Kitchen rOPINIRole (REQUIP) 0.25 MG tablet  . simvastatin (ZOCOR) 20 MG tablet  . DISCONTD: Magnesium 400 MG TABS    Sig: Take by mouth.  . Magnesium 500 MG CAPS    Sig: Take by mouth.  Marland Kitchen lisinopril-hydrochlorothiazide (PRINZIDE,ZESTORETIC) 10-12.5 MG tablet    Sig: Take 1 tablet by mouth daily.  . Multiple Vitamins-Minerals (MULTIVITAL PLATINUM SILVER PO)    Sig: Take by mouth.  . vitamin B-12 (CYANOCOBALAMIN) 1000 MCG tablet    Sig: Take 1,000 mcg by mouth daily.  . Cholecalciferol (D3-1000) 1000 units tablet    Sig: Take 1,000 Units by mouth daily.  Marland Kitchen EXTRA  STRENGTH ACETAMINOPHEN PO  Sig: Take by mouth.  . Glucosamine HCl (GLUCOSAMINE PO)    Sig: Take by mouth.    Patient Instructions  - stop taking lipitor - you likely to have muscle disease and we need further work up on that - will check blood work today - will need to do nerve conduction study - will need refer to PT/OT - established primary care physician - follow up in one month with me or Dr. Krista Blue whoever available.    Rosalin Hawking, MD PhD [facility] [address] [place], [place] [address] [phone]

## 2016-02-03 DIAGNOSIS — F17200 Nicotine dependence, unspecified, uncomplicated: Secondary | ICD-10-CM | POA: Insufficient documentation

## 2016-02-03 DIAGNOSIS — I10 Essential (primary) hypertension: Secondary | ICD-10-CM | POA: Insufficient documentation

## 2016-02-05 ENCOUNTER — Other Ambulatory Visit: Payer: Self-pay | Admitting: Neurology

## 2016-02-05 DIAGNOSIS — M6281 Muscle weakness (generalized): Secondary | ICD-10-CM

## 2016-02-05 DIAGNOSIS — M6289 Other specified disorders of muscle: Secondary | ICD-10-CM

## 2016-02-06 ENCOUNTER — Telehealth: Payer: Self-pay | Admitting: Neurology

## 2016-02-06 NOTE — Telephone Encounter (Signed)
I called wife back. She feels that the patient's weakness is a little worse since last office visit. It takes him a long time to get out of chairs, with assistance. Wife reports that he is not sleeping well, sleeps on his back and snores (no history of snoring). She states that getting an MRI was discussed at last office visit and asks if you want to order it?

## 2016-02-06 NOTE — Telephone Encounter (Signed)
Pt's wife called in stating pt is unable to get out of chair with out help now. He is also yelling in his sleep as well as snoring. She says he can hardly lift the toilet seat. She would like a call back to discuss.

## 2016-02-07 NOTE — Telephone Encounter (Signed)
Talked with pt wife over the phone. Pt was also sitting beside her. I have ordered MRi brain and C spine on 02/05/16 and orders are on the chart. However, pt did not get called for MRI appointment. I think he needs MRI ASAP. Could you please call [facility] or [facility] to get it done within this week. Pt is getting worse for sure.   He also has appointment with Dr. Krista Blue on 03/05/16 and Dr. Krista Blue is aware of it. But I think he is getting worse and may not able to wait that long. Could you reschedule with Dr. Krista Blue for next available? Thank you.  Rosalin Hawking, MD PhD Stroke Neurology 02/07/2016 6:07 PM

## 2016-02-07 NOTE — Telephone Encounter (Addendum)
Spoke to pt's wife - his appt has been moved to 02/15/16 w/ Dr. Krista Blue.  We will get his MRI scans schedule prior to this appt.

## 2016-02-08 LAB — TSH+FREE T4
FREE T4: 1.57 ng/dL (ref 0.82–1.77)
TSH: 1.33 u[IU]/mL (ref 0.450–4.500)

## 2016-02-08 LAB — ACETYLCHOLINE RECEPTOR AB, ALL
Acetylchol Block Ab: 21 % (ref 0–25)
Acetylcholine Modulat Ab: <12 % (ref 0–20)

## 2016-02-08 LAB — ALDOLASE: ALDOLASE: 4.7 U/L (ref 3.3–10.3)

## 2016-02-08 NOTE — Telephone Encounter (Signed)
I called [facility] to see when the soonest they could do the MRI they informed me not until 02/17/16. I then called [facility] and they can get him sooner than that hopefully by this weekend.. I faxed the orders to [facility].  [facility] was going to call the patient and set the appointment time

## 2016-02-08 NOTE — Telephone Encounter (Signed)
The patient is scheduled at [facility] for 02/13/16 at 8:30 AM.

## 2016-02-10 NOTE — Telephone Encounter (Signed)
Thank you so much for all your help!!  Rosalin Hawking, MD PhD Stroke Neurology 02/10/2016 6:30 AM

## 2016-02-13 DIAGNOSIS — M47812 Spondylosis without myelopathy or radiculopathy, cervical region: Secondary | ICD-10-CM | POA: Diagnosis not present

## 2016-02-13 DIAGNOSIS — M6289 Other specified disorders of muscle: Secondary | ICD-10-CM | POA: Diagnosis not present

## 2016-02-13 DIAGNOSIS — R29898 Other symptoms and signs involving the musculoskeletal system: Secondary | ICD-10-CM | POA: Diagnosis not present

## 2016-02-14 ENCOUNTER — Telehealth: Payer: Self-pay

## 2016-02-14 NOTE — Telephone Encounter (Signed)
Rn spoke with patients wife about lab work. Pts wife is on DPR form. Rn stated the lab work was normal. Pts wife verbalized understanding.

## 2016-02-14 NOTE — Telephone Encounter (Signed)
-----   Message from Rosalin Hawking, MD sent at 02/11/2016  6:48 AM EST ----- Could you please let the patient know that the blood test done recently in our office was within normal range. Please continue current treatment and we will wait for the MRI and your appointment with Dr. Krista Blue. Thanks.  Rosalin Hawking, MD PhD Stroke Neurology 02/11/2016 6:48 AM

## 2016-02-15 ENCOUNTER — Encounter: Payer: Self-pay | Admitting: Family Medicine

## 2016-02-15 ENCOUNTER — Ambulatory Visit (INDEPENDENT_AMBULATORY_CARE_PROVIDER_SITE_OTHER): Payer: Medicare Other | Admitting: Neurology

## 2016-02-15 ENCOUNTER — Encounter: Payer: Self-pay | Admitting: Neurology

## 2016-02-15 VITALS — BP 108/63 | HR 85 | Ht 66.0 in | Wt 136.5 lb

## 2016-02-15 DIAGNOSIS — M79609 Pain in unspecified limb: Secondary | ICD-10-CM | POA: Insufficient documentation

## 2016-02-15 DIAGNOSIS — M6289 Other specified disorders of muscle: Secondary | ICD-10-CM | POA: Diagnosis not present

## 2016-02-15 DIAGNOSIS — M79604 Pain in right leg: Secondary | ICD-10-CM | POA: Diagnosis not present

## 2016-02-15 DIAGNOSIS — M6281 Muscle weakness (generalized): Secondary | ICD-10-CM

## 2016-02-15 DIAGNOSIS — R269 Unspecified abnormalities of gait and mobility: Secondary | ICD-10-CM | POA: Diagnosis not present

## 2016-02-15 DIAGNOSIS — F17200 Nicotine dependence, unspecified, uncomplicated: Secondary | ICD-10-CM | POA: Diagnosis not present

## 2016-02-15 DIAGNOSIS — M79605 Pain in left leg: Secondary | ICD-10-CM

## 2016-02-15 MED ORDER — PREDNISONE 10 MG PO TABS: ORAL_TABLET | ORAL | 3 refills | Status: DC

## 2016-02-15 MED ORDER — DULOXETINE HCL 60 MG PO CPEP: 60.0000 mg | ORAL_CAPSULE | Freq: Every day | ORAL | 12 refills | Status: AC

## 2016-02-15 NOTE — Patient Instructions (Addendum)
Prednisone 10mg   1st week: 2tab every morning after breakfast. 2nd week 1 tab every morning,  Keep one tab every morning  till next follow up visit Dec 6th at 330pm

## 2016-02-15 NOTE — Progress Notes (Signed)
  PATIENT: Ronald Pacheco DOB: 04/08/1935  Chief Complaint  Patient presents with  . Generalized Weakness    He is here with his wife, Sharon, to review his NCV/EMG and MRI results.  He was referred by Dr. Xu for follow up.     HISTORICAL  Ronald Pacheco is a 80 years old right-handed male, accompanied by his wife Sharon, seen in refer by my colleague Dr. Xu  for evaluation of weakness, Initial evaluation was on February 15 2016  He used to be active, moved from [place] on September 28 2015, he had difficulty packing and unpacking, but it was not obvious, now they lived in the apartment on second floor, he described difficulty with stairs, he has to hold on hand rails to climb up, more difficulty going down, he complains of intermittent bilateral calf, and thigh muscle achy pain, especially with touch, he also complains of intermittent upper extremity muscle achy pain, weak grip, progressively getting worse over the past 5 months, he denies significant neck pain, low back pain, intermittent numbness at fingertips, but he denies bilateral toes, lower extremity paresthesia. He tends to have urinary urgency, which is at his baseline, no bowel and bladder incontinence.  EMG nerve conduction study by Dr. Peneumalli On February 02 2016 was reported normal, with exception of mild left carpal tunnel syndromes. Left upper and lower extremity motor and sensory examinations showed no significant abnormalities, with exception of mild left-sided carpal tunnel syndrome, selected needle examination of left lower extremity and left lumbosacral paraspinal muscles was reported normal.  Wife reported that he used to be very active, now has to sleep in a recliner, difficulty turning, mainly due to pain, whining howler pain at nighttime,   He is a longtime smoker, had a history of hyperlipidemia, has been treated with Lipitor for many years, hypertension, restless leg on low-dose Requip at night,    reviewed laboratory  evaluations in 2017, CPK was 22, normal TSH, and adolase level, negative acetylcholine receptor antibody, no significant abnormality on CMP, hemoglobin 12.2,  I personally reviewed MRI of the brain, extensive periventricular confluent white matter changes, differentiation diagnosis including small vessel disease versus other degenerative disorder, wife reported that patient's daughter was diagnosed with multiple sclerosis at age 40, was on treatment, symptoms has been stable.   Patient reported no history of migraine headaches, recently had bilateral frontal mild to moderate pressure pain,   MRI of cervical spine showed multilevel degenerative disc disease but no significant foraminal canal stenosis.  Today's physical examination is limited by his right shoulder pain reported a history of fall, right rotator cuff syndrome, bilateral upper and lower extremity deep muscle achy pain especially with deep palpitation, he denies bulbar weakness, no dysarthria, no dysphagia, no double vision, was noted to have mild to moderate facial diplegia, has mild to moderate weakness with eye closure, cheek puff,  REVIEW OF SYSTEMS: Full 14 system review of systems performed and notable only for as above  ALLERGIES: No Known Allergies  HOME MEDICATIONS: Current Outpatient Prescriptions  Medication Sig Dispense Refill  . Cholecalciferol (D3-1000) 1000 units tablet Take 1,000 Units by mouth daily.    . EXTRA STRENGTH ACETAMINOPHEN PO Take by mouth.    . Glucosamine HCl (GLUCOSAMINE PO) Take by mouth.    . lisinopril-hydrochlorothiazide (PRINZIDE,ZESTORETIC) 10-12.5 MG tablet Take 1 tablet by mouth daily.    . Magnesium 500 MG CAPS Take by mouth.    . Multiple Vitamins-Minerals (MULTIVITAL PLATINUM SILVER PO) Take by mouth.    .   rOPINIRole (REQUIP) 0.25 MG tablet     . tamsulosin (FLOMAX) 0.4 MG CAPS capsule Take 0.4 mg by mouth.    . traZODone (DESYREL) 50 MG tablet at bedtime.     . vitamin B-12  (CYANOCOBALAMIN) 1000 MCG tablet Take 1,000 mcg by mouth daily.     No current facility-administered medications for this visit.     PAST MEDICAL HISTORY: Past Medical History:  Diagnosis Date  . Headache   . Hearing loss   . Heart murmur   . Hyperlipidemia   . RLS (restless legs syndrome)   . Urinary frequency     PAST SURGICAL HISTORY: Past Surgical History:  Procedure Laterality Date  . CARPAL TUNNEL RELEASE Right 2016    FAMILY HISTORY: No family history on file.  SOCIAL HISTORY:  Social History   Social History  . Marital status: Married    Spouse name: N/A  . Number of children: N/A  . Years of education: N/A   Occupational History  . Not on file.   Social History Main Topics  . Smoking status: Current Every Day Smoker    Packs/day: 0.50    Years: 8.00    Types: Cigarettes  . Smokeless tobacco: Never Used     Comment: Quit for 23 years, started again  . Alcohol use 0.6 oz/week    1 Cans of beer per week     Comment: occasional  . Drug use: No  . Sexual activity: Not on file   Other Topics Concern  . Not on file   Social History Narrative  . No narrative on file     PHYSICAL EXAM   Vitals:   02/15/16 1419  BP: 108/63  Pulse: 85  Weight: 136 lb 8 oz (61.9 kg)  Height: 5' 6" (1.676 m)    Not recorded      Body mass index is 22.03 kg/m.  PHYSICAL EXAMNIATION:  Gen: NAD, conversant, well nourised, obese, well groomed                     Cardiovascular: Regular rate rhythm, no peripheral edema, warm, nontender. Eyes: Conjunctivae clear without exudates or hemorrhage Neck: Supple, no carotid bruits. Pulmonary: Clear to auscultation bilaterally   NEUROLOGICAL EXAM:  MENTAL STATUS: Speech:    Speech is normal; fluent and spontaneous with normal comprehension.  Cognition:     Orientation to time, place and person     Normal recent and remote memory     Normal Attention span and concentration     Normal Language, naming,  repeating,spontaneous speech     Fund of knowledge   CRANIAL NERVES: CN II: Visual fields are full to confrontation. Fundoscopic exam is normal with sharp discs and no vascular changes. Pupils are round equal and briskly reactive to light. CN III, IV, VI: extraocular movement are normal. No ptosis. CN V: Facial sensation is intact to pinprick in all 3 divisions bilaterally. Corneal responses are intact.  CN VII: Face is symmetric, he has mild to moderate eye-closure, cheek puff weakness. CN VIII: Hearing is normal to rubbing fingers CN IX, X: Palate elevates symmetrically. Phonation is normal. CN XI: Head turning and shoulder shrug are intact CN XII: Tongue is midline with normal movements and no atrophy.  MOTOR:  motor examination is limited by pain, especially with deep palpitation, was felt that he has moderate eye-closure, cheek puff mild neck flexion weakness, there was no significant proximal and lower extremity proximal weakness, mild to moderate  grip weakness this is also limited by pain,    REFLEXES: Reflexes are 2+ and symmetric at the biceps, triceps, knees, and ankles. Plantar responses are flexor.  SENSORY: Intact to light touch, pinprick, positional sensation and vibratory sensation are intact in fingers and toes.  COORDINATION: Rapid alternating movements and fine finger movements are intact. There is no dysmetria on finger-to-nose and heel-knee-shin.    GAIT/STANCE:  Needs to push up to get up from seated position, cautious, difficulty perform tiptoe, heel, tandem walking,    DIAGNOSTIC DATA (LABS, IMAGING, TESTING) - I reviewed patient records, labs, notes, testing and imaging myself where available.   ASSESSMENT AND PLAN  Ronald Pacheco is a 80 y.o. male   Diffuse body achy pain, new onset headaches,   need to rule out temporal arteritis, possibility also includes polymyalgia rheumatica  Reported normal EMG nerve conduction study, normal CPK level, negative  acetylcholine receptor antibody  Laboratory evaluation including ESR, C-reactive protein  Start Cymbalta 60 mg daily,  Empirically treat him with prednisone 10 mg 2 tablets daily for one week, then 10mg daily  Abnormal MRI of the brain:  Most likely small vessel disease, he does has vascular risk factor of longtime smoker, hypertension, hyperlipidemia  Advised him keep well hydration, aspirin 81 mg daily,    , M.D. Ph.D.  [facility] [address], [address] [place], [place] [address] Ph: [phone] Fax: [phone]  CC: Referring Provider 

## 2016-02-16 ENCOUNTER — Ambulatory Visit (INDEPENDENT_AMBULATORY_CARE_PROVIDER_SITE_OTHER): Payer: Self-pay

## 2016-02-16 ENCOUNTER — Telehealth: Payer: Self-pay | Admitting: Neurology

## 2016-02-16 DIAGNOSIS — M6281 Muscle weakness (generalized): Secondary | ICD-10-CM

## 2016-02-16 DIAGNOSIS — M6289 Other specified disorders of muscle: Secondary | ICD-10-CM

## 2016-02-16 DIAGNOSIS — Z0289 Encounter for other administrative examinations: Secondary | ICD-10-CM

## 2016-02-16 NOTE — Progress Notes (Signed)
I have reviewed and agreed above plan. 

## 2016-02-16 NOTE — Telephone Encounter (Signed)
I have called patient, he fell again this morning, getting out of the bed,  Laboratory evaluation showed significantly elevated C reactive protein 142, ESR of 67,   Normal CPK of 23,   Above findings support a diagnosis of polymyalgia rheumatica, I have suggested him to take prednisone 10 mg 2 tablets every morning, call back clinic for progress report next week,   Selinda Eon, please make sure you check on him early next week, if he has significant improvement, will stay on prednisone 20 mg every day for a while, if not, may consider higher dose of prednisone

## 2016-02-20 NOTE — Telephone Encounter (Signed)
Left message for a return call - need to check on patient's symptoms.

## 2016-02-20 NOTE — Telephone Encounter (Signed)
Spoke to patient's wife, Ivin Booty (on HIPAA) - states he is doing "great" - back to baseline.  He will continue the medication, as prescribed, and keep his follow up on 03/07/16.  I ask her to please call use with any changes or concerns.

## 2016-02-20 NOTE — Telephone Encounter (Signed)
Thank you, Yijun, for taking care of this pt. - Nakia Koble

## 2016-02-21 LAB — RPR: RPR: NONREACTIVE

## 2016-02-21 LAB — LACTIC ACID, PLASMA: Lactate, Ven: 6.1 mg/dL (ref 4.8–25.7)

## 2016-02-21 LAB — C-REACTIVE PROTEIN: CRP: 142 mg/L — AB (ref 0.0–4.9)

## 2016-02-21 LAB — FOLATE

## 2016-02-21 LAB — VITAMIN B12: Vitamin B-12: 1248 pg/mL — ABNORMAL HIGH (ref 211–946)

## 2016-02-21 LAB — SEDIMENTATION RATE: SED RATE: 67 mm/h — AB (ref 0–30)

## 2016-02-21 LAB — CK: Total CK: 23 U/L — ABNORMAL LOW (ref 24–204)

## 2016-02-21 LAB — ANA W/REFLEX IF POSITIVE: ANA: NEGATIVE

## 2016-02-21 LAB — HIV ANTIBODY (ROUTINE TESTING W REFLEX): HIV SCREEN 4TH GENERATION: NONREACTIVE

## 2016-02-29 ENCOUNTER — Encounter: Payer: Self-pay | Admitting: Family Medicine

## 2016-02-29 ENCOUNTER — Ambulatory Visit (INDEPENDENT_AMBULATORY_CARE_PROVIDER_SITE_OTHER): Payer: Medicare Other | Admitting: Family Medicine

## 2016-02-29 VITALS — BP 130/62 | HR 81 | Resp 12 | Ht 66.0 in | Wt 134.2 lb

## 2016-02-29 DIAGNOSIS — N401 Enlarged prostate with lower urinary tract symptoms: Secondary | ICD-10-CM | POA: Diagnosis not present

## 2016-02-29 DIAGNOSIS — R350 Frequency of micturition: Secondary | ICD-10-CM | POA: Diagnosis not present

## 2016-02-29 DIAGNOSIS — E782 Mixed hyperlipidemia: Secondary | ICD-10-CM | POA: Diagnosis not present

## 2016-02-29 DIAGNOSIS — M6281 Muscle weakness (generalized): Secondary | ICD-10-CM

## 2016-02-29 DIAGNOSIS — I10 Essential (primary) hypertension: Secondary | ICD-10-CM | POA: Diagnosis not present

## 2016-02-29 DIAGNOSIS — N400 Enlarged prostate without lower urinary tract symptoms: Secondary | ICD-10-CM | POA: Insufficient documentation

## 2016-02-29 DIAGNOSIS — M6289 Other specified disorders of muscle: Secondary | ICD-10-CM

## 2016-02-29 NOTE — Patient Instructions (Addendum)
A few things to remember from today's visit:   Essential hypertension  Benign prostatic hyperplasia with urinary frequency  Mixed hyperlipidemia  Muscle weakness of proximal extremity  Resume Simvastatin 20 mg. Resume Flomax.   Please be sure medication list is accurate. If a new problem present, please set up appointment sooner than planned today.

## 2016-02-29 NOTE — Progress Notes (Signed)
HPI:   Ronald Pacheco is a 80 y.o. male, who is here today with his wife, who helps with answering some questions, and to establish care with me.  [facility] [place]. Moved to this area in 09/2015.  Last preventive routine visit: 2017.   Concerns today: HLD   Hyperlipidemia:  He was on Simvastatin, stopped a month ago because after increasing dose from 20 mg to 40 mg he developed severe general myalgias, not able to move, worsening ; so he was evaluated in the ER.  Following a low fat diet: Not consistently. Denies Hx of CVD. Last lab work about a month ago with former PCP, according to wife cholesterol was "very high" and renal function was abnormal. He is not aware of any history of CKD in the past.  He also discontinued other medications, Flomax, which was prescribed a few months ago for urinary frequency and nocturia, according to patient, medication helps greatly with symptoms, which resolved after discontinuing medication. He denies dysuria, urine incontinence, or gross hematuria.    -He presented to the ER 02/01/2016 because a month of lower extremity pain, arthralgias, and limitation of ROM. Also headache, he is reporting Hx of migraines.  He attributes symptoms to simvastatin but according to wife he had stopped medication 2 weeks prior to ER visit.  He followed with neuro because muscle weakness, started on Cymbalta 60 mg and prednisone, according to wife, 24 hours after starting medications symptoms resolved;  Dx PMR  He is tolerating these medications well.  Lab Results  Component Value Date   CKTOTAL 23 (L) 02/15/2016   Lab Results  Component Value Date   ESRSEDRATE 67 (H) 02/15/2016   Lab Results  Component Value Date   CREATININE 1.05 02/01/2016   BUN 19 02/01/2016   NA 135 02/01/2016   K 3.9 02/01/2016   CL 101 02/01/2016   CO2 24 02/01/2016    He has history of insomnia, he was on trazodone 50 mg before, discontinue because exacerbated  headaches.   Hypertension:  Dx a couple years ago.  Currently he is on Lisinopril-HCTZ 10-12.5 mg daily. Reporting no side effects from medication. He denies any frequent/severe headache, visual changes, chest pain, dyspnea, palpitation, abdominal pain, nausea, vomiting, or edema.  Echo done recently LVEF normal.  He is not exercising regularly.   + Hearing loss, hearing aid bilateral.  In general independent ADLs and IADLs, he denies any falls in the past year and denies history of depression. According to wife, he has some difficulty with "memory" for a while, he doesn't seem to be affecting his social interaction and it has not beened mentioned during appointments with neurologist. Denies Hx of CVA, tremor, or gait instability.  02/17/16 brain MRI:  1.   Extensive T2/FLAIR hyperintense foci in both hemispheres consistent with severe chronic microvascular ischemic change. None of the foci appeared to be acute. 2.    Mild-to-moderate generalized cortical atrophy, more pronounced in the mesial temporal lobes. 3.    There is a normal enhancement pattern and there are no acute findings.  Cervical MRI: 1.   Multilevel degenerative changes as detailed above. There is no significant spinal stenosis though the central canal is mildly narrowed at C4-C5, C5-C6 and C6-C7. 2.    At C3-C4 there is moderate left foraminal narrowing due to a left disc osteophyte complex that could lead to left C4 nerve root compression. 3.    The spinal cord appears normal.  4.  There are no acute findings and there is a normal enhancement pattern.   History of B12 deficiency, currently he is on B12 100 g daily. He has history of arthralgias, he takes glucosamine OTC. + Smoker, intermittently for many years, started back about 8 years ago after 23 years of tobacco free. He lives with wife.    Review of Systems  Constitutional: Negative for appetite change, fatigue, fever and unexpected weight change.   HENT: Positive for hearing loss. Negative for nosebleeds, sore throat and trouble swallowing.   Eyes: Negative for redness and visual disturbance.  Respiratory: Negative for cough, shortness of breath and wheezing.   Cardiovascular: Negative for chest pain, palpitations and leg swelling.  Gastrointestinal: Negative for abdominal pain, nausea and vomiting.  Genitourinary: Positive for frequency. Negative for decreased urine volume, dysuria and hematuria.  Musculoskeletal: Positive for arthralgias. Negative for myalgias.  Skin: Negative for rash.  Neurological: Negative for syncope, weakness and headaches.  Psychiatric/Behavioral: Negative for confusion. The patient is not nervous/anxious.       Current Outpatient Prescriptions on File Prior to Visit  Medication Sig Dispense Refill  . DULoxetine (CYMBALTA) 60 MG capsule Take 1 capsule (60 mg total) by mouth daily. 30 capsule 12  . lisinopril-hydrochlorothiazide (PRINZIDE,ZESTORETIC) 10-12.5 MG tablet Take 1 tablet by mouth daily.    . predniSONE (DELTASONE) 10 MG tablet Take as instructed. 60 tablet 3   No current facility-administered medications on file prior to visit.      Past Medical History:  Diagnosis Date  . Headache   . Hearing loss   . Heart murmur   . Hyperlipidemia   . RLS (restless legs syndrome)   . Urinary frequency    No Known Allergies  No family history on file.  Social History   Social History  . Marital status: Married    Spouse name: N/A  . Number of children: N/A  . Years of education: N/A   Social History Main Topics  . Smoking status: Current Every Day Smoker    Packs/day: 0.50    Years: 8.00    Types: Cigarettes  . Smokeless tobacco: Never Used     Comment: Quit for 23 years, started again  . Alcohol use 0.6 oz/week    1 Cans of beer per week     Comment: occasional  . Drug use: No  . Sexual activity: Not Asked   Other Topics Concern  . None   Social History Narrative  . None     Vitals:   02/29/16 1058  BP: 130/62  Pulse: 81  Resp: 12    Body mass index is 21.67 kg/m.    Physical Exam  Nursing note and vitals reviewed. Constitutional: He is oriented to person, place, and time. He appears well-developed and well-nourished. No distress.  HENT:  Head: Atraumatic.  Right Ear: Decreased hearing is noted.  Left Ear: Decreased hearing is noted.  Mouth/Throat: Oropharynx is clear and moist and mucous membranes are normal. He has dentures.  Eyes: Conjunctivae and EOM are normal. Pupils are equal, round, and reactive to light.  Cardiovascular: Normal rate and regular rhythm.   Murmur (SEM I/VI base) heard. Pulses:      Dorsalis pedis pulses are 2+ on the right side, and 2+ on the left side.  Respiratory: Effort normal and breath sounds normal. No respiratory distress.  GI: Soft. He exhibits no mass. There is no hepatomegaly. There is no tenderness.  Musculoskeletal: He exhibits no edema.  Neurological: He is  alert and oriented to person, place, and time. He has normal strength.  Stable gait, slow, no assisted  Skin: Skin is warm. No erythema.  Psychiatric: He has a normal mood and affect.  Well groomed, good eye contact.      ASSESSMENT AND PLAN:     Hassen was seen today for establish care.  Diagnoses and all orders for this visit:  Essential hypertension  Adequately controlled. No changes in current management. DASH diet recommended. Eye exam recommended annually. F/U in 1-2 months, will re-check renal function next OV.  Benign prostatic hyperplasia with urinary frequency  Resume Flomax 0.4 mg daily, which was helping with symptoms. F/U in 4-6 weeks.  Mixed hyperlipidemia  We discussed some benefits and side effects of statin medications, it seems like he was tolerating well Simvastatin 20 mg and it does not seem to me like higher dose was causing muscle weakness because it didn't improve after discontinuation of medication. He  agrees with resuming simvastatin 20 mg daily, he needs to improve low-fat diet. I will recheck lipid panel in 4-6 weeks.  Muscle weakness of proximal extremity  Dx recently with PMR, symptoms resolved after starting prednisone. Continue following with neurologist, Dr Krista Blue. Instructed about warning signs.   -I also instructed wife to addressed her concerns about Mr. Kaeding's memory issues with Dr Krista Blue.       Madgeline Rayo G. Martinique, MD  [facility]. [place] office.

## 2016-02-29 NOTE — Progress Notes (Signed)
Pre visit review using our clinic review tool, if applicable. No additional management support is needed unless otherwise documented below in the visit note. 

## 2016-03-05 ENCOUNTER — Ambulatory Visit: Payer: Medicare Other | Admitting: Neurology

## 2016-03-07 ENCOUNTER — Ambulatory Visit (INDEPENDENT_AMBULATORY_CARE_PROVIDER_SITE_OTHER): Payer: Medicare Other | Admitting: Neurology

## 2016-03-07 ENCOUNTER — Encounter: Payer: Self-pay | Admitting: Neurology

## 2016-03-07 VITALS — BP 150/74 | HR 95 | Ht 66.0 in | Wt 137.0 lb

## 2016-03-07 DIAGNOSIS — M353 Polymyalgia rheumatica: Secondary | ICD-10-CM | POA: Diagnosis not present

## 2016-03-07 DIAGNOSIS — R51 Headache: Secondary | ICD-10-CM

## 2016-03-07 DIAGNOSIS — R519 Headache, unspecified: Secondary | ICD-10-CM | POA: Insufficient documentation

## 2016-03-07 NOTE — Progress Notes (Signed)
  PATIENT: Ronald Pacheco DOB: 04/17/1935  Chief Complaint  Patient presents with  . Diffuse Body Aches    He is here with his wife, Sharon.  Reports his achy body pain has improved with Cymbalta and Prednisone.  He has been having headaches nearly every day.  He has been using Tylenol to treat his pain.       HISTORICAL  Ronald Pacheco is a 80 years old right-handed male, accompanied by his wife Sharon, seen in refer by my colleague Dr. Xu  for evaluation of weakness, Initial evaluation was on February 15 2016  He used to be active, moved from [place] on September 28 2015, he had difficulty packing and unpacking, but it was not obvious, now they lived in the apartment on second floor, he described difficulty with stairs, he has to hold on hand rails to climb up, more difficulty going down, he complains of intermittent bilateral calf, and thigh muscle achy pain, especially with touch, he also complains of intermittent upper extremity muscle achy pain, weak grip, progressively getting worse over the past 5 months, he denies significant neck pain, low back pain, intermittent numbness at fingertips, but he denies bilateral toes, lower extremity paresthesia. He tends to have urinary urgency, which is at his baseline, no bowel and bladder incontinence.  EMG nerve conduction study by Dr. Peneumalli On February 02 2016 was reported normal, with exception of mild left carpal tunnel syndromes. Left upper and lower extremity motor and sensory examinations showed no significant abnormalities, with exception of mild left-sided carpal tunnel syndrome, selected needle examination of left lower extremity and left lumbosacral paraspinal muscles was reported normal.  Wife reported that he used to be very active, now has to sleep in a recliner, difficulty turning, mainly due to pain, whining howler pain at nighttime,   He is a longtime smoker, had a history of hyperlipidemia, has been treated with Lipitor for many years,  hypertension, restless leg on low-dose Requip at night,    reviewed laboratory evaluations in 2017, CPK was 22, normal TSH, and adolase level, negative acetylcholine receptor antibody, no significant abnormality on CMP, hemoglobin 12.2,  I personally reviewed MRI of the brain, extensive periventricular confluent white matter changes, differentiation diagnosis including small vessel disease versus other degenerative disorder, wife reported that patient's daughter was diagnosed with multiple sclerosis at age 40, was on treatment, symptoms has been stable.   Patient reported no history of migraine headaches, recently had bilateral frontal mild to moderate pressure pain,   MRI of cervical spine showed multilevel degenerative disc disease but no significant foraminal canal stenosis.  Today's physical examination is limited by his right shoulder pain reported a history of fall, right rotator cuff syndrome, bilateral upper and lower extremity deep muscle achy pain especially with deep palpitation, he denies bulbar weakness, no dysarthria, no dysphagia, no double vision, was noted to have mild to moderate facial diplegia, has mild to moderate weakness with eye closure, cheek puff,  UPDATE Dec 6th 2017: He started prednisone 20 mg February 16 2016, within few hours, he noticed dramatic improvement, he has much less muscle achy pain, can move much better, which continued to improve, he is now back to his baseline, no significant change after prednisone dose was decreased to 10 mg daily  He is also taking Cymbalta 60 mg, laboratory evaluation showed significant elevated C reactive protein 142, ESR 67, with normal CPK, electrodiagnostic study, support the diagnosis of polymyalgia rheumatica  He had frequent bilateral frontal headache,   responded well to Tylenol  REVIEW OF SYSTEMS: Full 14 system review of systems performed and notable only for as above  ALLERGIES: No Known Allergies  HOME  MEDICATIONS: Current Outpatient Prescriptions  Medication Sig Dispense Refill  . atorvastatin (LIPITOR) 20 MG tablet Take 20 mg by mouth daily.    . Cholecalciferol (D3-1000) 1000 units capsule Take 1,000 Units by mouth daily.    . DULoxetine (CYMBALTA) 60 MG capsule Take 1 capsule (60 mg total) by mouth daily. 30 capsule 12  . GLUCOSAMINE HCL PO Take 1 tablet by mouth daily.    . lisinopril-hydrochlorothiazide (PRINZIDE,ZESTORETIC) 10-12.5 MG tablet Take 1 tablet by mouth daily.    . predniSONE (DELTASONE) 10 MG tablet Take as instructed. (Patient taking differently: Take 10 mg by mouth daily. ) 60 tablet 3  . vitamin B-12 (CYANOCOBALAMIN) 1000 MCG tablet Take 1,000 mcg by mouth daily.     No current facility-administered medications for this visit.     PAST MEDICAL HISTORY: Past Medical History:  Diagnosis Date  . Headache   . Hearing loss   . Heart murmur   . Hyperlipidemia   . RLS (restless legs syndrome)   . Urinary frequency     PAST SURGICAL HISTORY: Past Surgical History:  Procedure Laterality Date  . CARPAL TUNNEL RELEASE Right 2016    FAMILY HISTORY: No family history on file.  SOCIAL HISTORY:  Social History   Social History  . Marital status: Married    Spouse name: N/A  . Number of children: N/A  . Years of education: N/A   Occupational History  . Not on file.   Social History Main Topics  . Smoking status: Current Every Day Smoker    Packs/day: 0.50    Years: 8.00    Types: Cigarettes  . Smokeless tobacco: Never Used     Comment: Quit for 23 years, started again  . Alcohol use 0.6 oz/week    1 Cans of beer per week     Comment: occasional  . Drug use: No  . Sexual activity: Not on file   Other Topics Concern  . Not on file   Social History Narrative  . No narrative on file     PHYSICAL EXAM   Vitals:   03/07/16 1542  BP: (!) 150/74  Pulse: 95  Weight: 137 lb (62.1 kg)  Height: 5' 6" (1.676 m)    Not recorded      Body mass  index is 22.11 kg/m.  PHYSICAL EXAMNIATION:  Gen: NAD, conversant, well nourised, obese, well groomed                     Cardiovascular: Regular rate rhythm, no peripheral edema, warm, nontender. Eyes: Conjunctivae clear without exudates or hemorrhage Neck: Supple, no carotid bruits. Pulmonary: Clear to auscultation bilaterally   NEUROLOGICAL EXAM:  MENTAL STATUS: Speech:    Speech is normal; fluent and spontaneous with normal comprehension.  Cognition:     Orientation to time, place and person     Normal recent and remote memory     Normal Attention span and concentration     Normal Language, naming, repeating,spontaneous speech     Fund of knowledge   CRANIAL NERVES: CN II: Visual fields are full to confrontation. Fundoscopic exam is normal with sharp discs and no vascular changes. Pupils are round equal and briskly reactive to light. CN III, IV, VI: extraocular movement are normal. No ptosis. CN V: Facial sensation is intact to   pinprick in all 3 divisions bilaterally. Corneal responses are intact.  CN VII: Face is symmetric, he has mild to moderate eye-closure, cheek puff weakness. CN VIII: Hearing is normal to rubbing fingers CN IX, X: Palate elevates symmetrically. Phonation is normal. CN XI: Head turning and shoulder shrug are intact CN XII: Tongue is midline with normal movements and no atrophy.  MOTOR:  motor examination is limited by pain, especially with deep palpitation, was felt that he has moderate eye-closure, cheek puff mild neck flexion weakness, there was no significant proximal and lower extremity proximal weakness, mild to moderate grip weakness this is also limited by pain,    REFLEXES: Reflexes are 2+ and symmetric at the biceps, triceps, knees, and ankles. Plantar responses are flexor.  SENSORY: Intact to light touch, pinprick, positional sensation and vibratory sensation are intact in fingers and toes.  COORDINATION: Rapid alternating movements and  fine finger movements are intact. There is no dysmetria on finger-to-nose and heel-knee-shin.    GAIT/STANCE:  Needs to push up to get up from seated position, cautious, difficulty perform tiptoe, heel, tandem walking,    DIAGNOSTIC DATA (LABS, IMAGING, TESTING) - I reviewed patient records, labs, notes, testing and imaging myself where available.   ASSESSMENT AND PLAN  Ronald Pacheco is a 80 y.o. male   Polymyalgia rheumatica  Dramatic response to prednisone  Repeat ESR C-reactive protein  Continue prednisone 10 mg daily, Cymbalta 60 mg a day  Abnormal MRI of the brain:  Most likely small vessel disease, he does has vascular risk factor of longtime smoker, hypertension, hyperlipidemia  Advised him keep well hydration, aspirin 81 mg daily,   Ronald Pacheco, M.D. Ph.D.  [facility] [address], [place], [place] [address] Ph: [phone] Fax: [phone]  CC: Referring Provider

## 2016-03-08 LAB — SEDIMENTATION RATE: SED RATE: 19 mm/h (ref 0–30)

## 2016-03-08 LAB — CK: Total CK: 19 U/L — ABNORMAL LOW (ref 24–204)

## 2016-03-08 LAB — C-REACTIVE PROTEIN: CRP: 4.5 mg/L (ref 0.0–4.9)

## 2016-04-02 HISTORY — PX: CORONARY ANGIOPLASTY WITH STENT PLACEMENT: SHX49

## 2016-04-14 DIAGNOSIS — I219 Acute myocardial infarction, unspecified: Secondary | ICD-10-CM

## 2016-04-14 DIAGNOSIS — R079 Chest pain, unspecified: Secondary | ICD-10-CM | POA: Diagnosis not present

## 2016-04-14 DIAGNOSIS — I10 Essential (primary) hypertension: Secondary | ICD-10-CM | POA: Diagnosis not present

## 2016-04-14 DIAGNOSIS — G441 Vascular headache, not elsewhere classified: Secondary | ICD-10-CM | POA: Diagnosis not present

## 2016-04-14 HISTORY — DX: Acute myocardial infarction, unspecified: I21.9

## 2016-04-15 DIAGNOSIS — R079 Chest pain, unspecified: Secondary | ICD-10-CM | POA: Diagnosis not present

## 2016-04-15 DIAGNOSIS — R7989 Other specified abnormal findings of blood chemistry: Secondary | ICD-10-CM | POA: Diagnosis not present

## 2016-04-15 DIAGNOSIS — R739 Hyperglycemia, unspecified: Secondary | ICD-10-CM | POA: Diagnosis not present

## 2016-04-15 DIAGNOSIS — R51 Headache: Secondary | ICD-10-CM | POA: Diagnosis not present

## 2016-04-15 DIAGNOSIS — I214 Non-ST elevation (NSTEMI) myocardial infarction: Secondary | ICD-10-CM | POA: Diagnosis not present

## 2016-04-15 DIAGNOSIS — I129 Hypertensive chronic kidney disease with stage 1 through stage 4 chronic kidney disease, or unspecified chronic kidney disease: Secondary | ICD-10-CM | POA: Diagnosis not present

## 2016-04-16 DIAGNOSIS — R739 Hyperglycemia, unspecified: Secondary | ICD-10-CM | POA: Diagnosis not present

## 2016-04-16 DIAGNOSIS — I214 Non-ST elevation (NSTEMI) myocardial infarction: Secondary | ICD-10-CM | POA: Diagnosis not present

## 2016-04-16 DIAGNOSIS — I25118 Atherosclerotic heart disease of native coronary artery with other forms of angina pectoris: Secondary | ICD-10-CM | POA: Diagnosis not present

## 2016-04-16 DIAGNOSIS — R51 Headache: Secondary | ICD-10-CM | POA: Diagnosis not present

## 2016-04-16 DIAGNOSIS — R079 Chest pain, unspecified: Secondary | ICD-10-CM | POA: Diagnosis not present

## 2016-04-16 DIAGNOSIS — R7989 Other specified abnormal findings of blood chemistry: Secondary | ICD-10-CM | POA: Diagnosis not present

## 2016-04-17 DIAGNOSIS — R51 Headache: Secondary | ICD-10-CM | POA: Diagnosis not present

## 2016-04-17 DIAGNOSIS — R739 Hyperglycemia, unspecified: Secondary | ICD-10-CM | POA: Diagnosis not present

## 2016-04-17 DIAGNOSIS — I129 Hypertensive chronic kidney disease with stage 1 through stage 4 chronic kidney disease, or unspecified chronic kidney disease: Secondary | ICD-10-CM | POA: Diagnosis not present

## 2016-04-17 DIAGNOSIS — I214 Non-ST elevation (NSTEMI) myocardial infarction: Secondary | ICD-10-CM | POA: Diagnosis not present

## 2016-04-17 DIAGNOSIS — R7989 Other specified abnormal findings of blood chemistry: Secondary | ICD-10-CM | POA: Diagnosis not present

## 2016-04-17 DIAGNOSIS — R079 Chest pain, unspecified: Secondary | ICD-10-CM | POA: Diagnosis not present

## 2016-04-17 LAB — POCT INR: INR: 1 (ref 0.9–1.1)

## 2016-04-17 LAB — HEPATIC FUNCTION PANEL
ALT: 20 U/L (ref 10–40)
AST: 14 U/L (ref 14–40)
Alkaline Phosphatase: 46 U/L (ref 25–125)
Bilirubin, Total: 0.6 mg/dL

## 2016-04-17 LAB — CBC AND DIFFERENTIAL
HEMATOCRIT: 39 % — AB (ref 41–53)
Hemoglobin: 13.1 g/dL — AB (ref 13.5–17.5)
NEUTROS ABS: 6 /uL
PLATELETS: 129 10*3/uL — AB (ref 150–399)
WBC: 7.9 10*3/mL

## 2016-04-17 LAB — LIPID PANEL
Cholesterol: 289 mg/dL — AB (ref 0–200)
HDL: 65 mg/dL (ref 35–70)
LDL CALC: 181 mg/dL
Triglycerides: 213 mg/dL — AB (ref 40–160)

## 2016-04-17 LAB — BASIC METABOLIC PANEL
BUN: 17 mg/dL (ref 4–21)
Creatinine: 1 mg/dL (ref 0.6–1.3)
Glucose: 88 mg/dL
Potassium: 4.2 mmol/L (ref 3.4–5.3)
Sodium: 139 mmol/L (ref 137–147)

## 2016-04-17 LAB — HEMOGLOBIN A1C: HEMOGLOBIN A1C: 6

## 2016-04-23 ENCOUNTER — Ambulatory Visit: Payer: Medicare Other | Admitting: Family Medicine

## 2016-05-01 ENCOUNTER — Ambulatory Visit: Payer: Medicare Other | Admitting: Family Medicine

## 2016-05-03 ENCOUNTER — Ambulatory Visit (INDEPENDENT_AMBULATORY_CARE_PROVIDER_SITE_OTHER): Payer: Medicare Other | Admitting: Family Medicine

## 2016-05-03 ENCOUNTER — Encounter: Payer: Self-pay | Admitting: Family Medicine

## 2016-05-03 VITALS — BP 120/70 | HR 76 | Resp 12 | Ht 66.0 in | Wt 140.2 lb

## 2016-05-03 DIAGNOSIS — I10 Essential (primary) hypertension: Secondary | ICD-10-CM | POA: Diagnosis not present

## 2016-05-03 DIAGNOSIS — G2581 Restless legs syndrome: Secondary | ICD-10-CM | POA: Diagnosis not present

## 2016-05-03 DIAGNOSIS — I2510 Atherosclerotic heart disease of native coronary artery without angina pectoris: Secondary | ICD-10-CM | POA: Diagnosis not present

## 2016-05-03 DIAGNOSIS — E782 Mixed hyperlipidemia: Secondary | ICD-10-CM | POA: Diagnosis not present

## 2016-05-03 MED ORDER — LISINOPRIL-HYDROCHLOROTHIAZIDE 10-12.5 MG PO TABS: 1.0000 | ORAL_TABLET | Freq: Every day | ORAL | 1 refills | Status: DC

## 2016-05-03 MED ORDER — ROPINIROLE HCL 0.25 MG PO TABS: 0.2500 mg | ORAL_TABLET | Freq: Every day | ORAL | 0 refills | Status: DC

## 2016-05-03 NOTE — Progress Notes (Signed)
HPI:   Ronald Pacheco is a 81 y.o. male, who is here today with his wife to follow on recent hospitalization, 04/15/16-04/19/16 while he was in [place].  Last seen 02/29/16. He also follows with neurologists for PMR and on chronic Prednisone.  He presented to the ER the day of admission because left-sided chest pain while he was at rest. Earlier same day he had frontal headache and facial pain, he thought he was having sinus symptoms or one of his migraines. Later he started with sudden onset of chest pressure. S/P stenting coronary artery. He was discharged home, did not need PT, appetitive is normal and tolerating medications well.  He is back at his baseline, feels "great." Discharged on Plavix 75 mg, Crestor 40 mg (changed from Lipitor and tolerating better), and Coreg 3.125 mg bid.   HTN, he is still on Lisinopril-HCTZ 10-12.5 mg daily.  Denies severe/frequent headache, visual changes, chest pain, dyspnea, palpitation, claudication, focal weakness, or edema.  Still smoking.  Lab Results  Component Value Date   CREATININE 1.05 02/01/2016   BUN 19 02/01/2016   NA 135 02/01/2016   K 3.9 02/01/2016   CL 101 02/01/2016   CO2 24 02/01/2016     RLS: He is currently on Requip 0.25 mg 2 tabs daily. He is not longer symptoms tic and wonders if he can discontinued medication. No side effects reported. He has ben taking it for a while.  At the time of his visit I do not have records from hospitalization.  No new concerns today.   Review of Systems  Constitutional: Negative for activity change, appetite change, fatigue, fever and unexpected weight change.  HENT: Negative for nosebleeds, sore throat and trouble swallowing.   Eyes: Negative for redness and visual disturbance.  Respiratory: Negative for cough, shortness of breath and wheezing.   Cardiovascular: Negative for chest pain, palpitations and leg swelling.  Gastrointestinal: Negative for abdominal pain, nausea  and vomiting.       No changes in bowel habits.  Genitourinary: Negative for decreased urine volume and hematuria.  Musculoskeletal: Negative for gait problem and myalgias.  Skin: Negative for rash.  Neurological: Negative for dizziness, weakness, numbness and headaches.  Psychiatric/Behavioral: Negative for confusion. The patient is not nervous/anxious.       Current Outpatient Prescriptions on File Prior to Visit  Medication Sig Dispense Refill  . Cholecalciferol (D3-1000) 1000 units capsule Take 1,000 Units by mouth daily.    . DULoxetine (CYMBALTA) 60 MG capsule Take 1 capsule (60 mg total) by mouth daily. 30 capsule 12  . vitamin B-12 (CYANOCOBALAMIN) 1000 MCG tablet Take 1,000 mcg by mouth daily.     No current facility-administered medications on file prior to visit.      Past Medical History:  Diagnosis Date  . Headache   . Hearing loss   . Heart murmur   . Hyperlipidemia   . Myocardial infarction    04/2016   . RLS (restless legs syndrome)   . Urinary frequency    No Known Allergies  Social History   Social History  . Marital status: Married    Spouse name: N/A  . Number of children: N/A  . Years of education: N/A   Social History Main Topics  . Smoking status: Current Every Day Smoker    Packs/day: 0.50    Years: 8.00    Types: Cigarettes  . Smokeless tobacco: Never Used     Comment: Quit for 23 years, started  again  . Alcohol use 0.6 oz/week    1 Cans of beer per week     Comment: occasional  . Drug use: No  . Sexual activity: Not Asked   Other Topics Concern  . None   Social History Narrative  . None    Vitals:   05/03/16 1030  BP: 120/70  Pulse: 76  Resp: 12  O2 sat at RA 98%. Body mass index is 22.63 kg/m.   Physical Exam  Nursing note and vitals reviewed. Constitutional: He is oriented to person, place, and time. He appears well-developed. No distress.  HENT:  Head: Atraumatic.  Mouth/Throat: Oropharynx is clear and moist and  mucous membranes are normal.  Eyes: Conjunctivae and EOM are normal. Pupils are equal, round, and reactive to light.  Neck: No JVD present.  Cardiovascular: Normal rate and regular rhythm.   Murmur (SEM I-II/VI base , solf heard on apex) heard. Pulses:      Dorsalis pedis pulses are 2+ on the right side, and 2+ on the left side.  Respiratory: Effort normal and breath sounds normal. No respiratory distress.  GI: Soft. He exhibits no mass. There is no hepatomegaly. There is no tenderness.  Musculoskeletal: He exhibits no edema.  Lymphadenopathy:    He has no cervical adenopathy.  Neurological: He is alert and oriented to person, place, and time. He has normal strength.  Skin: Skin is warm. No erythema.  Psychiatric: He has a normal mood and affect. Cognition and memory are normal.  Well groomed, good eye contact.      ASSESSMENT AND PLAN:     [place]wick was seen today for hospitalization follow-up.  Diagnoses and all orders for this visit:  Coronary artery disease involving native coronary artery of native heart without angina pectoris  Asymptomatic. Continue current management. Appt with cardiologists will be arranged. Instructed about warning signs. Must quit smoking. -     Ambulatory referral to Cardiology  Essential hypertension  Adequately controlled. No changes in current management. DASH diet recommended. Eye exam recommended annually. F/U in 4 months, before if needed.  -     lisinopril-hydrochlorothiazide (PRINZIDE,ZESTORETIC) 10-12.5 MG tablet; Take 1 tablet by mouth daily.  Mixed hyperlipidemia  Tolerating Crestor well. Low fat diet to continue. F/U in 3-4 months.  RLS (restless legs syndrome)  Asymptomatic. He will decrease dose of Requip from 0.5 mg to 0.25 mg (1 tab). We will continue decreasing dose if still asymptomatic.  -     rOPINIRole (REQUIP) 0.25 MG tablet; Take 1 tablet (0.25 mg total) by mouth at bedtime. Take 1 tablet in the  evening.      -Mr. Kyven Lutton was advised to return sooner than planned today if new concerns arise.       Maicie Vanderloop G. [place], MD  [facility]. [place] office.

## 2016-05-03 NOTE — Progress Notes (Signed)
Pre visit review using our clinic review tool, if applicable. No additional management support is needed unless otherwise documented below in the visit note. 

## 2016-05-03 NOTE — Patient Instructions (Addendum)
A few things to remember from today's visit:   Essential hypertension - Plan: lisinopril-hydrochlorothiazide (PRINZIDE,ZESTORETIC) 10-12.5 MG tablet  Coronary artery disease involving native coronary artery of native heart without angina pectoris - Plan: Ambulatory referral to Cardiology  Mixed hyperlipidemia  RLS (restless legs syndrome) - Plan: rOPINIRole (REQUIP) 0.25 MG tablet   Today leg medication decreased to 1 tab at night.  Rest no changes. Please be sure medication list is accurate. If a new problem present, please set up appointment sooner than planned today.

## 2016-05-07 ENCOUNTER — Encounter: Payer: Self-pay | Admitting: Family Medicine

## 2016-05-08 ENCOUNTER — Ambulatory Visit: Payer: Medicare Other | Admitting: Neurology

## 2016-05-10 ENCOUNTER — Ambulatory Visit: Payer: Medicare Other | Admitting: Physician Assistant

## 2016-05-15 ENCOUNTER — Ambulatory Visit (INDEPENDENT_AMBULATORY_CARE_PROVIDER_SITE_OTHER): Payer: Medicare Other | Admitting: Neurology

## 2016-05-15 ENCOUNTER — Encounter: Payer: Self-pay | Admitting: Neurology

## 2016-05-15 VITALS — BP 116/64 | HR 74 | Ht 66.0 in | Wt 137.2 lb

## 2016-05-15 DIAGNOSIS — R269 Unspecified abnormalities of gait and mobility: Secondary | ICD-10-CM | POA: Diagnosis not present

## 2016-05-15 DIAGNOSIS — I2510 Atherosclerotic heart disease of native coronary artery without angina pectoris: Secondary | ICD-10-CM

## 2016-05-15 DIAGNOSIS — M353 Polymyalgia rheumatica: Secondary | ICD-10-CM

## 2016-05-15 MED ORDER — PREDNISONE 2.5 MG PO TABS: 5.0000 mg | ORAL_TABLET | Freq: Every day | ORAL | 6 refills | Status: AC

## 2016-05-15 NOTE — Progress Notes (Signed)
PATIENT: Ronald Pacheco DOB: 1935-04-16  Chief Complaint  Patient presents with  . Diffuse body pain    He is here with his wife, Ivin Booty.  Feels his symptoms are well controlled on Prednisone and Cymbalta.  Reports having a heart attack on 04/14/16, while in [place], that resulted in him having two stents placed.     HISTORICAL  Ronald Pacheco is a 81 years old right-handed male, accompanied by his wife Ivin Booty, seen in refer by my colleague Dr. Erlinda Hong  for evaluation of weakness, Initial evaluation was on February 15 2016  He used to be active, moved from [place] on September 28 2015, he had difficulty packing and unpacking, but it was not obvious, now they lived in the apartment on second floor, he described difficulty with stairs, he has to hold on hand rails to climb up, more difficulty going down, he complains of intermittent bilateral calf, and thigh muscle achy pain, especially with touch, he also complains of intermittent upper extremity muscle achy pain, weak grip, progressively getting worse over the past 5 months, he denies significant neck pain, low back pain, intermittent numbness at fingertips, but he denies bilateral toes, lower extremity paresthesia. He tends to have urinary urgency, which is at his baseline, no bowel and bladder incontinence.  EMG nerve conduction study by Dr. Tamera Reason On February 02 2016 was reported normal, with exception of mild left carpal tunnel syndromes. Left upper and lower extremity motor and sensory examinations showed no significant abnormalities, with exception of mild left-sided carpal tunnel syndrome, selected needle examination of left lower extremity and left lumbosacral paraspinal muscles was reported normal.  Wife reported that he used to be very active, now has to sleep in a recliner, difficulty turning, mainly due to pain, whining howler pain at nighttime,   He is a longtime smoker, had a history of hyperlipidemia, has been treated with Lipitor for many  years, hypertension, restless leg on low-dose Requip at night,    reviewed laboratory evaluations in 2017, CPK was 22, normal TSH, and adolase level, negative acetylcholine receptor antibody, no significant abnormality on CMP, hemoglobin 12.2,  I personally reviewed MRI of the brain, extensive periventricular confluent white matter changes, differentiation diagnosis including small vessel disease versus other degenerative disorder, wife reported that patient's daughter was diagnosed with multiple sclerosis at age 39, was on treatment, symptoms has been stable.   Patient reported no history of migraine headaches, recently had bilateral frontal mild to moderate pressure pain,   MRI of cervical spine showed multilevel degenerative disc disease but no significant foraminal canal stenosis.  Today's physical examination is limited by his right shoulder pain reported a history of fall, right rotator cuff syndrome, bilateral upper and lower extremity deep muscle achy pain especially with deep palpitation, he denies bulbar weakness, no dysarthria, no dysphagia, no double vision, was noted to have mild to moderate facial diplegia, has mild to moderate weakness with eye closure, cheek puff,  UPDATE Dec 6th 2017: He started prednisone 20 mg February 16 2016, within few hours, he noticed dramatic improvement, he has much less muscle achy pain, can move much better, which continued to improve, he is now back to his baseline, no significant change after prednisone dose was decreased to 10 mg daily  He is also taking Cymbalta 60 mg, laboratory evaluation showed significant elevated C reactive protein 142, ESR 67, with normal CPK, electrodiagnostic study, support the diagnosis of polymyalgia rheumatica  He had frequent bilateral frontal headache, responded well to  Tylenol  UPDATE May 15 2016: He had heart attack on April 14 2016 at [place], had to stand, he denies significant muscle achy pain, is now taking  prednisone 10 mg daily, continue to smoke pack a day.  REVIEW OF SYSTEMS: Full 14 system review of systems performed and notable only for as above  ALLERGIES: No Known Allergies  HOME MEDICATIONS: Current Outpatient Prescriptions  Medication Sig Dispense Refill  . aspirin EC 81 MG tablet Take 81 mg by mouth daily.    . carvedilol (COREG) 3.125 MG tablet Take 1 tablet in the morning and 1 tablet at night.    . Cholecalciferol (D3-1000) 1000 units capsule Take 1,000 Units by mouth daily.    . DULoxetine (CYMBALTA) 60 MG capsule Take 1 capsule (60 mg total) by mouth daily. 30 capsule 12  . lisinopril-hydrochlorothiazide (PRINZIDE,ZESTORETIC) 10-12.5 MG tablet Take 1 tablet by mouth daily. 90 tablet 1  . nitroGLYCERIN (NITROSTAT) 0.4 MG SL tablet     . predniSONE (DELTASONE) 10 MG tablet Take 10 mg by mouth daily with breakfast.    . rOPINIRole (REQUIP) 0.25 MG tablet Take 1 tablet (0.25 mg total) by mouth at bedtime. Take 1 tablet in the evening. 90 tablet 0  . rosuvastatin (CRESTOR) 40 MG tablet Take 40 mg by mouth daily.    . vitamin B-12 (CYANOCOBALAMIN) 1000 MCG tablet Take 1,000 mcg by mouth daily.     No current facility-administered medications for this visit.     PAST MEDICAL HISTORY: Past Medical History:  Diagnosis Date  . Headache   . Hearing loss   . Heart attack 04/14/2016   Two stents placed  . Heart murmur   . Hyperlipidemia   . Myocardial infarction    04/2016   . RLS (restless legs syndrome)   . Urinary frequency     PAST SURGICAL HISTORY: Past Surgical History:  Procedure Laterality Date  . CARPAL TUNNEL RELEASE Right 2016  . PLACEMENT OF STENTS  04/2016    FAMILY HISTORY: No family history on file.  SOCIAL HISTORY:  Social History   Social History  . Marital status: Married    Spouse name: N/A  . Number of children: N/A  . Years of education: N/A   Occupational History  . Not on file.   Social History Main Topics  . Smoking status: Current  Every Day Smoker    Packs/day: 0.50    Years: 8.00    Types: Cigarettes  . Smokeless tobacco: Never Used     Comment: Quit for 23 years, started again  . Alcohol use 0.6 oz/week    1 Cans of beer per week     Comment: occasional  . Drug use: No  . Sexual activity: Not on file   Other Topics Concern  . Not on file   Social History Narrative  . No narrative on file     PHYSICAL EXAM   Vitals:   05/15/16 1026  BP: 116/64  Pulse: 74  Weight: 137 lb 4 oz (62.3 kg)  Height: '5\' 6"'$  (1.676 m)    Not recorded      Body mass index is 22.15 kg/m.  PHYSICAL EXAMNIATION:  Gen: NAD, conversant, well nourised, obese, well groomed                     Cardiovascular: Regular rate rhythm, no peripheral edema, warm, nontender. Eyes: Conjunctivae clear without exudates or hemorrhage Neck: Supple, no carotid bruits. Pulmonary: Clear to auscultation bilaterally  NEUROLOGICAL EXAM:  MENTAL STATUS: Speech:    Speech is normal; fluent and spontaneous with normal comprehension.  Cognition:     Orientation to time, place and person     Normal recent and remote memory     Normal Attention span and concentration     Normal Language, naming, repeating,spontaneous speech     Fund of knowledge   CRANIAL NERVES: CN II: Visual fields are full to confrontation. Fundoscopic exam is normal with sharp discs and no vascular changes. Pupils are round equal and briskly reactive to light. CN III, IV, VI: extraocular movement are normal. No ptosis. CN V: Facial sensation is intact to pinprick in all 3 divisions bilaterally. Corneal responses are intact.  CN VII: Face is symmetric, he has mild to moderate eye-closure, cheek puff weakness. CN VIII: Hearing is normal to rubbing fingers CN IX, X: Palate elevates symmetrically. Phonation is normal. CN XI: Head turning and shoulder shrug are intact CN XII: Tongue is midline with normal movements and no atrophy.  MOTOR:  motor examination is limited  by pain, especially with deep palpitation, was felt that he has moderate eye-closure, cheek puff mild neck flexion weakness, there was no significant proximal and lower extremity proximal weakness, mild to moderate grip weakness this is also limited by pain,    REFLEXES: Reflexes are 2+ and symmetric at the biceps, triceps, knees, and ankles. Plantar responses are flexor.  SENSORY: Intact to light touch, pinprick, positional sensation and vibratory sensation are intact in fingers and toes.  COORDINATION: Rapid alternating movements and fine finger movements are intact. There is no dysmetria on finger-to-nose and heel-knee-shin.    GAIT/STANCE:  Needs to push up to get up from seated position, cautious, difficulty perform tiptoe, heel, tandem walking,    DIAGNOSTIC DATA (LABS, IMAGING, TESTING) - I reviewed patient records, labs, notes, testing and imaging myself where available.   ASSESSMENT AND PLAN  Ronald Pacheco is a 81 y.o. male   Polymyalgia rheumatica  Dramatic response to prednisone  Decrease prednisone from 10 to 2.5 milligrams 2 tablets a day  Cymbalta 60 mg a day  Abnormal MRI of the brain:  Most likely small vessel disease, he does has vascular risk factor of longtime smoker, hypertension, hyperlipidemia  Advised him keep well hydration, aspirin 81 mg daily,   Marcial Pacas, M.D. Ph.D.  [facility] [address], [place], [place] [address] Ph: [phone] Fax: [phone]  CC: Referring Provider

## 2016-05-22 ENCOUNTER — Ambulatory Visit: Payer: Medicare Other | Admitting: Physician Assistant

## 2016-05-30 DIAGNOSIS — T148XXA Other injury of unspecified body region, initial encounter: Secondary | ICD-10-CM | POA: Diagnosis not present

## 2016-05-30 DIAGNOSIS — M7989 Other specified soft tissue disorders: Secondary | ICD-10-CM | POA: Diagnosis not present

## 2016-06-01 ENCOUNTER — Encounter: Payer: Self-pay | Admitting: Physician Assistant

## 2016-06-01 ENCOUNTER — Ambulatory Visit (INDEPENDENT_AMBULATORY_CARE_PROVIDER_SITE_OTHER): Payer: Medicare Other | Admitting: Physician Assistant

## 2016-06-01 VITALS — BP 112/58 | HR 90 | Temp 88.0°F | Ht 66.0 in | Wt 137.0 lb

## 2016-06-01 DIAGNOSIS — E785 Hyperlipidemia, unspecified: Secondary | ICD-10-CM | POA: Diagnosis not present

## 2016-06-01 DIAGNOSIS — I219 Acute myocardial infarction, unspecified: Secondary | ICD-10-CM

## 2016-06-01 DIAGNOSIS — Z720 Tobacco use: Secondary | ICD-10-CM | POA: Diagnosis not present

## 2016-06-01 DIAGNOSIS — I2510 Atherosclerotic heart disease of native coronary artery without angina pectoris: Secondary | ICD-10-CM

## 2016-06-01 MED ORDER — BRILINTA 90 MG PO TABS: 90.0000 mg | ORAL_TABLET | Freq: Two times a day (BID) | ORAL | 6 refills | Status: DC

## 2016-06-01 NOTE — Progress Notes (Signed)
Cardiology Office Note   Date:  06/01/2016   ID:  Ronald Pacheco, DOB [birth year], MRN [id number]  PCP:  Betty Martinique, MD  Cardiologist:  Dr Oliver Barre, PA-C   Chief Complaint  Patient presents with  . New Patient (Initial Visit)    History of Present Illness: Ronald Pacheco is a 81 y.o. male with a history of SEM, HLD, RLS, tob use, HOH. No hx HTN, DM. ?PMR on steroids w/ slow taper.  04/2016, MI while in [place], stents to [place] and RCA.   Ronald Pacheco presents for to establish care.  He has been doing fairly well since d/c. He still smokes. He thinks he can quit but says he needs it to calm himself down right now. He does not want to do cardiac rehab, says he will be active at home.   He does not exercise, walks a little. He is able to climb a flight of stairs without stopping. He does this frequently because they live on the second floor. He does not get SOB with this. He does not feel he has any limitations in his activity.   No chest pain with activity, no PND or orthopnea. No LE edema. No palpitations or presyncope/syncope.  Dr Jacinto Reap. Martinique follows his cholesterol.   He does not know his EF.   He tries to watch what he eats but does not really read labels.  He was riding back from [place] and developed a hematoma on the back of his R hand. It is improving. No other recent injuries or problems. No bleeding issues on the aspirin and Brilinta.   Past Medical History:  Diagnosis Date  . Headache   . Hearing loss   . Heart murmur   . Hyperlipidemia   . MI (myocardial infarction) 04/14/2016   [facility] in [place], [place]. Stent card shows Multi-Link Vision 2.75 x 28 mm stent RCA, 2.5 x 28 mm stent mLAD  . RLS (restless legs syndrome)   . Urinary frequency     Past Surgical History:  Procedure Laterality Date  . CARPAL TUNNEL RELEASE Right 2016  . CORONARY ANGIOPLASTY WITH STENT PLACEMENT  04/2016    Current Outpatient  Prescriptions  Medication Sig Dispense Refill  . aspirin EC 81 MG tablet Take 81 mg by mouth daily.    Marland Kitchen BRILINTA 90 MG TABS tablet Take 90 mg by mouth 2 (two) times daily.  0  . carvedilol (COREG) 3.125 MG tablet Take 1 tablet in the morning and 1 tablet at night.    . Cholecalciferol (D3-1000) 1000 units capsule Take 1,000 Units by mouth daily.    Marland Kitchen doxycycline (VIBRAMYCIN) 100 MG capsule Take 1 capsule by mouth 2 (two) times daily.    . DULoxetine (CYMBALTA) 60 MG capsule Take 1 capsule (60 mg total) by mouth daily. 30 capsule 12  . lisinopril-hydrochlorothiazide (PRINZIDE,ZESTORETIC) 10-12.5 MG tablet Take 1 tablet by mouth daily. 90 tablet 1  . nitroGLYCERIN (NITROSTAT) 0.4 MG SL tablet     . predniSONE (DELTASONE) 2.5 MG tablet Take 2 tablets (5 mg total) by mouth daily with breakfast. 60 tablet 6  . rOPINIRole (REQUIP) 0.25 MG tablet Take 1 tablet (0.25 mg total) by mouth at bedtime. Take 1 tablet in the evening. 90 tablet 0  . rosuvastatin (CRESTOR) 40 MG tablet Take 40 mg by mouth daily.    . vitamin B-12 (CYANOCOBALAMIN) 1000 MCG tablet Take 1,000 mcg by mouth daily.  No current facility-administered medications for this visit.     Allergies:   Patient has no known allergies.    Social History:  The patient  reports that he has been smoking Cigarettes.  He has a 4.00 pack-year smoking history. He has never used smokeless tobacco. He reports that he drinks about 0.6 oz of alcohol per week . He reports that he does not use drugs.   Family History:  The patient's family history includes CAD in his father and mother; Cancer - Lung in his brother; Leukemia in his father.    ROS:  Please see the history of present illness. All other systems are reviewed and negative.    PHYSICAL EXAM: VS:  BP (!) 112/58 (BP Location: Left Arm)   Pulse 90   Temp (!) 88 F (31.1 C)   Ht 5\' 6"  (1.676 m)   Wt 137 lb (62.1 kg)   BMI 22.11 kg/m  , BMI Body mass index is 22.11 kg/m. GEN: Well  nourished, well developed, male in no acute distress  HEENT: normal for age  Neck: no JVD, no carotid bruit, no masses Cardiac: RRR; 2/6 murmur, no rubs, or gallops Respiratory:  Scattered rales bilaterally, normal work of breathing GI: soft, nontender, nondistended, + BS MS: no deformity or atrophy; no edema; distal pulses are 2+ in all 4 extremities   Skin: warm and dry, no rash Neuro:  Strength and sensation are intact Psych: euthymic mood, full affect   EKG:  EKG is ordered today. The ekg ordered today demonstrates sinus rhythm, no acute ischemic changes, no pathologic Q waves, normal intervals   Recent Labs: 02/02/2016: TSH 1.330 04/17/2016: ALT 20; BUN 17; Creatinine 1.0; Hemoglobin 13.1; Platelets 129; Potassium 4.2; Sodium 139    Lipid Panel    Component Value Date/Time   CHOL 289 (A) 04/17/2016   TRIG 213 (A) 04/17/2016   HDL 65 04/17/2016   LDLCALC 181 04/17/2016     Wt Readings from Last 3 Encounters:  06/01/16 137 lb (62.1 kg)  05/15/16 137 lb 4 oz (62.3 kg)  05/03/16 140 lb 3 oz (63.6 kg)     Other studies Reviewed: Additional studies/ records that were reviewed today include: Stent card and discharge paperwork from [place], PCP notes and information in [place].  ASSESSMENT AND PLAN:  1.  CAD, s/p MI: He is doing well post MI. He is on appropriate medical therapy with aspirin, Brilinta, Coreg, ACE inhibitor/diuretic and high-dose statin. He is encouraged to increase his activity slowly and steadily. No dose increases possible and his carvedilol because I'm not sure his blood pressure would tolerate it. Continue current dosing for now.  We do not know his EF but he is asymptomatic for now. He may need assessment in the future.  2. Hyperlipidemia: His LDL at the time of his MI was 181. He is on high-dose statin. He has been on this for over 6 weeks. Continue the statin, go ahead and check a lipid profile and a complete metabolic panel. He can then follow-up with Dr.  B. Martinique if he wishes.  3. Tobacco use: Smoking cessation was discussed and encouraged. He will think about it.   Current medicines are reviewed at length with the patient today.  The patient does not have concerns regarding medicines.  The following changes have been made:  His Brilinta had no refills, these were sent in.  Labs/ tests ordered today include:   Orders Placed This Encounter  Procedures  . Comprehensive Metabolic  Panel (CMET)  . Lipid panel     Disposition:   FU with Dr. Oval Linsey  Signed, Barrett, [address]  06/01/2016 4:48 PM    [place] Phone: [phone]; Fax: [phone]  This note was written with the assistance of speech recognition software. Please excuse any transcriptional errors.

## 2016-06-01 NOTE — Patient Instructions (Addendum)
Medication Instructions:  Your physician recommends that you continue on your current medications as directed. Please refer to the Current Medication list given to you today.  Labwork: Your physician recommends that you return for lab work in: Next WEEK--CMET AND LIPID--FASTING THIS LAB WORK IS BEING ORDERED BECAUSE YOUR BAD CHOLESTEROL WAS ELEVATED REALLY HIGH.  AND WE WANT TO MAKE SURE IT IS COMING DOWN AND BE ING BETTER CONTROLED. DR Martinique YOUR PCP CAN MONITOR YOUR CHOLESTEROL IF YOU LIKE OR WE CAN HERE.  Testing/Procedures: NONE   Follow-Up: Your physician wants you to follow-up in: [place]. You will receive a reminder letter in the mail two months in advance. If you don't receive a letter, please call our office to schedule the follow-up appointment.    Any Other Special Instructions Will Be Listed Below (If Applicable).  QUIT SMOKING  DR [place]ingham HOPES YOU ARE A NON-SMOKER BY THE TIME SHE MEETS YOU   If you need a refill on your cardiac medications before your next appointment, please call your pharmacy.

## 2016-06-04 DIAGNOSIS — E782 Mixed hyperlipidemia: Secondary | ICD-10-CM | POA: Diagnosis not present

## 2016-06-04 LAB — LIPID PANEL
CHOL/HDL RATIO: 2 ratio (ref <5.0)
CHOLESTEROL: 151 mg/dL (ref <200)
HDL: 77 mg/dL (ref >40)
LDL Cholesterol: 52 mg/dL (ref <100)
TRIGLYCERIDES: 112 mg/dL (ref <150)
VLDL: 22 mg/dL (ref <30)

## 2016-06-04 LAB — COMPREHENSIVE METABOLIC PANEL
ALT: 19 U/L (ref 9–46)
AST: 19 U/L (ref 10–35)
Albumin: 3.8 g/dL (ref 3.6–5.1)
Alkaline Phosphatase: 43 U/L (ref 40–115)
BUN: 16 mg/dL (ref 7–25)
CHLORIDE: 102 mmol/L (ref 98–110)
CO2: 30 mmol/L (ref 20–31)
Calcium: 9.7 mg/dL (ref 8.6–10.3)
Creat: 1.28 mg/dL — ABNORMAL HIGH (ref 0.70–1.18)
Glucose, Bld: 86 mg/dL (ref 65–99)
Potassium: 3.9 mmol/L (ref 3.5–5.3)
SODIUM: 140 mmol/L (ref 135–146)
Total Bilirubin: 0.7 mg/dL (ref 0.2–1.2)
Total Protein: 6 g/dL — ABNORMAL LOW (ref 6.1–8.1)

## 2016-06-05 ENCOUNTER — Other Ambulatory Visit: Payer: Self-pay | Admitting: *Deleted

## 2016-06-05 ENCOUNTER — Encounter: Payer: Self-pay | Admitting: *Deleted

## 2016-06-05 MED ORDER — LISINOPRIL 10 MG PO TABS: 10.0000 mg | ORAL_TABLET | Freq: Every day | ORAL | 1 refills | Status: AC

## 2016-06-05 NOTE — Addendum Note (Signed)
Addended by: Ulice Brilliant T on: 06/05/2016 10:35 AM   Modules accepted: Orders

## 2016-06-19 ENCOUNTER — Ambulatory Visit: Payer: Medicare Other | Admitting: Physician Assistant

## 2016-06-19 ENCOUNTER — Ambulatory Visit (INDEPENDENT_AMBULATORY_CARE_PROVIDER_SITE_OTHER): Payer: Medicare Other | Admitting: Family Medicine

## 2016-06-19 ENCOUNTER — Encounter: Payer: Self-pay | Admitting: Family Medicine

## 2016-06-19 VITALS — BP 124/62 | HR 78 | Resp 12 | Ht 66.0 in | Wt 140.2 lb

## 2016-06-19 DIAGNOSIS — I10 Essential (primary) hypertension: Secondary | ICD-10-CM

## 2016-06-19 DIAGNOSIS — L989 Disorder of the skin and subcutaneous tissue, unspecified: Secondary | ICD-10-CM

## 2016-06-19 DIAGNOSIS — I2510 Atherosclerotic heart disease of native coronary artery without angina pectoris: Secondary | ICD-10-CM

## 2016-06-19 DIAGNOSIS — M353 Polymyalgia rheumatica: Secondary | ICD-10-CM | POA: Diagnosis not present

## 2016-06-19 MED ORDER — CARVEDILOL 3.125 MG PO TABS: 3.1250 mg | ORAL_TABLET | Freq: Two times a day (BID) | ORAL | 3 refills | Status: DC

## 2016-06-19 NOTE — Patient Instructions (Addendum)
A few things to remember from today's visit:   Skin lesion of right upper extremity - Plan: Ambulatory referral to Dermatology  Coronary artery disease involving native coronary artery of native heart without angina pectoris - Plan: carvedilol (COREG) 3.125 MG tablet  Essential hypertension - Plan: carvedilol (COREG) 3.125 MG tablet  Sun screen, continue antibiotic ointment.  Please be sure medication list is accurate. If a new problem present, please set up appointment sooner than planned today.

## 2016-06-19 NOTE — Progress Notes (Signed)
Pre visit review using our clinic review tool, if applicable. No additional management support is needed unless otherwise documented below in the visit note. 

## 2016-06-19 NOTE — Progress Notes (Signed)
HPI:   ACUTE VISIT:  Chief Complaint  Patient presents with  . knot on arm    Ronald Pacheco is a 81 y.o. male, who is here today complaining of skin lesions he noted on right forearm about 2 months ago. He is not sure if he had it before he noticed it. He has not noted changes in size, painful when he hits it against something or by applying moderate pressure otherwise it does not hurt. He denies pruritus.  He has not tried any treatment with OTC topical medication.   He tells me that recently at the [place] he had right ear lavage, develop a "cut" in ear canal,it got infected and treated with oral abx.He wonders of this could be related to lesion on forearm.   -He also has some questions about some of medications he is taking.  He wonders if she needs to continue Coreg 3.125 bid,which was added after he had MI in 04/2016. He denies side effects.He follows with cardiologists,last seen 06/01/16. He needs refills.   Hx of HTN,he is also on Lisinopril 10 mg daily. Denies headache, visual changes, chest pain, dyspnea, palpitation, claudication, focal weakness, or edema. Still smoking,trying to cut back.  He is also taking Brilinta 90 mg bid,which is expensive. Taking Aspirin 81 mg, not sure if this is the one he is supposed to be taking.  Also questions about Prednisone, taking 5 mg daily and slowly being tapered off. This medication has been prescribed by neuro,Dr Krista Blue, to treat PMR.  Planning on relocating in 07/2016.    Review of Systems  Constitutional: Positive for fatigue. Negative for appetite change, fever and unexpected weight change.  HENT: Negative for mouth sores, nosebleeds, sore throat and trouble swallowing.   Respiratory: Negative for cough, shortness of breath and wheezing.   Cardiovascular: Negative for chest pain, palpitations and leg swelling.  Gastrointestinal: Negative for abdominal pain, nausea and vomiting.       No changes in bowel habits.    Musculoskeletal: Negative for gait problem, joint swelling and myalgias.  Skin: Negative for rash and wound.  Neurological: Negative for weakness, numbness and headaches.  Psychiatric/Behavioral: Negative for confusion. The patient is not nervous/anxious.       Current Outpatient Prescriptions on File Prior to Visit  Medication Sig Dispense Refill  . aspirin EC 81 MG tablet Take 81 mg by mouth daily.    Marland Kitchen BRILINTA 90 MG TABS tablet Take 1 tablet (90 mg total) by mouth 2 (two) times daily. 60 tablet 6  . Cholecalciferol (D3-1000) 1000 units capsule Take 1,000 Units by mouth daily.    Marland Kitchen doxycycline (VIBRAMYCIN) 100 MG capsule Take 1 capsule by mouth 2 (two) times daily.    . DULoxetine (CYMBALTA) 60 MG capsule Take 1 capsule (60 mg total) by mouth daily. 30 capsule 12  . lisinopril (PRINIVIL,ZESTRIL) 10 MG tablet Take 1 tablet (10 mg total) by mouth daily. 90 tablet 1  . nitroGLYCERIN (NITROSTAT) 0.4 MG SL tablet     . predniSONE (DELTASONE) 2.5 MG tablet Take 2 tablets (5 mg total) by mouth daily with breakfast. 60 tablet 6  . rOPINIRole (REQUIP) 0.25 MG tablet Take 1 tablet (0.25 mg total) by mouth at bedtime. Take 1 tablet in the evening. 90 tablet 0  . rosuvastatin (CRESTOR) 40 MG tablet Take 40 mg by mouth daily.    . vitamin B-12 (CYANOCOBALAMIN) 1000 MCG tablet Take 1,000 mcg by mouth daily.     No current  facility-administered medications on file prior to visit.      Past Medical History:  Diagnosis Date  . Headache   . Hearing loss   . Heart murmur   . Hyperlipidemia   . MI (myocardial infarction) 04/14/2016   [facility] in [place], [place]. Stent card shows Multi-Link Vision 2.75 x 28 mm stent RCA, 2.5 x 28 mm stent mLAD  . RLS (restless legs syndrome)   . Urinary frequency    No Known Allergies  Social History   Social History  . Marital status: Married    Spouse name: N/A  . Number of children: N/A  . Years of education: N/A   Social History Main  Topics  . Smoking status: Current Every Day Smoker    Packs/day: 0.50    Years: 8.00    Types: Cigarettes  . Smokeless tobacco: Never Used     Comment: Quit for 23 years, started again  . Alcohol use 0.6 oz/week    1 Cans of beer per week     Comment: occasional  . Drug use: No  . Sexual activity: Not Asked   Other Topics Concern  . None   Social History Narrative  . None    Vitals:   06/19/16 1502  BP: 124/62  Pulse: 78  Resp: 12   Body mass index is 22.64 kg/m.   Physical Exam  Nursing note and vitals reviewed. Constitutional: He is oriented to person, place, and time. He appears well-developed. No distress.  HENT:  Head: Atraumatic.  Mouth/Throat: Oropharynx is clear and moist and mucous membranes are normal.  Eyes: Conjunctivae and EOM are normal. Pupils are equal, round, and reactive to light.  Cardiovascular: Normal rate and regular rhythm.   Murmur (SEM I-II/VI base ) heard. Respiratory: Effort normal and breath sounds normal. No respiratory distress.  Musculoskeletal: He exhibits no edema.  Lymphadenopathy:    He has no cervical adenopathy.  Neurological: He is alert and oriented to person, place, and time. He has normal strength.  Skin: Skin is warm. Lesion and rash noted. Rash is nodular. No erythema.     1.5 cm nodular lesion on right forearm, not tender with palpation,no local heat, firm. No ulceration appreciated and defined borders.  Psychiatric: He has a normal mood and affect. Cognition and memory are normal.  Well groomed, good eye contact.      ASSESSMENT AND PLAN:   Izaia was seen today for knot on arm.  Diagnoses and all orders for this visit:  Skin lesion of right upper extremity  We discussed possible Dx's, ? SCC. Dermatology evaluation will be arranged. Sun screen recommended.  -     Ambulatory referral to Dermatology  Coronary artery disease involving native coronary artery of native heart without angina pectoris  We  discussed some side effects of Coreg and benefits.Recommended continuing medication. He needs to follow with Dr Kalman Shan to discuss options for Brinlinta and if this can be switched to Plavix. Continue ASA 81 mg daily.  -     carvedilol (COREG) 3.125 MG tablet; Take 1 tablet (3.125 mg total) by mouth 2 (two) times daily with a meal. Take 1 tablet in the morning and 1 tablet at night.  Essential hypertension  Well controlled. Continue Lisinopril and Coreg. Low salt diet. Monitor BP at home.  -     carvedilol (COREG) 3.125 MG tablet; Take 1 tablet (3.125 mg total) by mouth 2 (two) times daily with a meal. Take 1 tablet in  the morning and 1 tablet at night.  Polymyalgia rheumatica (HCC)  We dicussed the reason he is on prednisone. Recommend continue weaning off as instructed by Dr Krista Blue.    -Mr.Journee Kohen was advised to return or notify a doctor immediately if symptoms worsen or new concerns arise.       Rana Hochstein G. Martinique, MD  [facility]. [place] office.

## 2016-07-02 ENCOUNTER — Ambulatory Visit (HOSPITAL_COMMUNITY)
Admission: EM | Admit: 2016-07-02 | Discharge: 2016-07-02 | Disposition: A | Discharge status: Home / Self Care | Payer: Medicare Other | Attending: Family Medicine | Admitting: Family Medicine

## 2016-07-02 ENCOUNTER — Encounter (HOSPITAL_COMMUNITY): Payer: Self-pay | Admitting: Family Medicine

## 2016-07-02 DIAGNOSIS — L858 Other specified epidermal thickening: Secondary | ICD-10-CM | POA: Diagnosis not present

## 2016-07-02 NOTE — ED Triage Notes (Signed)
Pt here for wound to right arm close to wrist area.

## 2016-07-02 NOTE — Discharge Instructions (Signed)
For any worsening, signs of infection, red streaks, pus, continuous bleeding or other problems recheck with your primary care provider. Reed the diagnosis and looked this up on the Internet as there are several pictures and information that describes this type of lesion.

## 2016-07-02 NOTE — ED Provider Notes (Signed)
CSN: [id number]     Arrival date & time 07/02/16  1013 History   First MD Initiated Contact with Patient 07/02/16 1046     Chief Complaint  Patient presents with  . Wound Check   (Consider location/radiation/quality/duration/timing/severity/associated sxs/prior Treatment) 81 year old male presents with a lesion to the right forearm. He states it started about 2-3 weeks ago. No history of trauma. No known injury. Mildly tender. He states it was initially dome shaped but last night he accidentally struck it on something and it bled and the roof caved and somewhat. Currently no bleeding.      Past Medical History:  Diagnosis Date  . Headache   . Hearing loss   . Heart murmur   . Hyperlipidemia   . MI (myocardial infarction) 04/14/2016   [facility] in [place], [place]. Stent card shows Multi-Link Vision 2.75 x 28 mm stent RCA, 2.5 x 28 mm stent mLAD  . RLS (restless legs syndrome)   . Urinary frequency    Past Surgical History:  Procedure Laterality Date  . CARPAL TUNNEL RELEASE Right 2016  . CORONARY ANGIOPLASTY WITH STENT PLACEMENT  04/2016   Family History  Problem Relation Age of Onset  . CAD Mother     Died at 82, CABG at 66  . CAD Father   . Leukemia Father   . Cancer - Lung Brother    Social History  Substance Use Topics  . Smoking status: Current Every Day Smoker    Packs/day: 0.50    Years: 8.00    Types: Cigarettes  . Smokeless tobacco: Never Used     Comment: Quit for 23 years, started again  . Alcohol use 0.6 oz/week    1 Cans of beer per week     Comment: occasional    Review of Systems  Gastrointestinal: Negative.   Musculoskeletal: Negative.   Skin: Negative for rash.       As per HPI  All other systems reviewed and are negative.   Allergies  Patient has no known allergies.  Home Medications   Prior to Admission medications   Medication Sig Start Date End Date Taking? Authorizing Provider  aspirin EC 81 MG tablet Take 81 mg by  mouth daily.    Historical Provider, MD  BRILINTA 90 MG TABS tablet Take 1 tablet (90 mg total) by mouth 2 (two) times daily. 06/01/16   Rhonda G Barrett, PA-C  carvedilol (COREG) 3.125 MG tablet Take 1 tablet (3.125 mg total) by mouth 2 (two) times daily with a meal. Take 1 tablet in the morning and 1 tablet at night. 06/19/16   Betty G Martinique, MD  Cholecalciferol (D3-1000) 1000 units capsule Take 1,000 Units by mouth daily.    Historical Provider, MD  doxycycline (VIBRAMYCIN) 100 MG capsule Take 1 capsule by mouth 2 (two) times daily. 05/30/16   Historical Provider, MD  DULoxetine (CYMBALTA) 60 MG capsule Take 1 capsule (60 mg total) by mouth daily. 02/15/16   Marcial Pacas, MD  lisinopril (PRINIVIL,ZESTRIL) 10 MG tablet Take 1 tablet (10 mg total) by mouth daily. 06/05/16 09/03/16  Evelene Croon Barrett, PA-C  nitroGLYCERIN (NITROSTAT) 0.4 MG SL tablet  04/17/16   Historical Provider, MD  predniSONE (DELTASONE) 2.5 MG tablet Take 2 tablets (5 mg total) by mouth daily with breakfast. 05/15/16   Marcial Pacas, MD  rOPINIRole (REQUIP) 0.25 MG tablet Take 1 tablet (0.25 mg total) by mouth at bedtime. Take 1 tablet in the evening. 05/03/16   Inez Catalina  G Martinique, MD  rosuvastatin (CRESTOR) 40 MG tablet Take 40 mg by mouth daily.    Historical Provider, MD  vitamin B-12 (CYANOCOBALAMIN) 1000 MCG tablet Take 1,000 mcg by mouth daily.    Historical Provider, MD   Meds Ordered and Administered this Visit  Medications - No data to display  BP (!) 149/63   Pulse 68   Temp 98.6 F (37 C)   Resp 18   SpO2 100%  No data found.   Physical Exam  Constitutional: He is oriented to person, place, and time. He appears well-developed and well-nourished. No distress.  Eyes: EOM are normal.  Neck: Neck supple.  Cardiovascular: Normal rate.   Pulmonary/Chest: Effort normal. No respiratory distress.  Musculoskeletal: He exhibits no edema.  Neurological: He is alert and oriented to person, place, and time. He exhibits normal muscle tone.   Skin: Skin is warm and dry.  There is a red dome shaped lesion to the right forearm presented almost certainly approximately 2-3 weeks ago. He states it is mildly tender. He is well marginated and annular. No leaching. Last night he accidentally struck it against an object and it bled and as a result of the dome caved in somewhat. No pain. No signs of infection currently. It measures 14 mm in diameter.  Psychiatric: He has a normal mood and affect.  Nursing note and vitals reviewed.   Urgent Care Course     Procedures (including critical care time)  Labs Review Labs Reviewed - No data to display  Imaging Review No results found.     Visual Acuity Review  Right Eye Distance:   Left Eye Distance:   Bilateral Distance:    Right Eye Near:   Left Eye Near:    Bilateral Near:         MDM   1. Keratoacanthoma of forearm    For any worsening, signs of infection, red streaks, pus, continuous bleeding or other problems recheck with your primary care provider. Reed the diagnosis and looked this up on the Internet as there are several pictures and information that describes this type of lesion.       Janne Napoleon, NP 07/02/16 1114

## 2016-07-05 DIAGNOSIS — B360 Pityriasis versicolor: Secondary | ICD-10-CM | POA: Diagnosis not present

## 2016-07-05 DIAGNOSIS — L821 Other seborrheic keratosis: Secondary | ICD-10-CM | POA: Diagnosis not present

## 2016-07-05 DIAGNOSIS — L814 Other melanin hyperpigmentation: Secondary | ICD-10-CM | POA: Diagnosis not present

## 2016-07-05 DIAGNOSIS — D1801 Hemangioma of skin and subcutaneous tissue: Secondary | ICD-10-CM | POA: Diagnosis not present

## 2016-07-05 DIAGNOSIS — C44622 Squamous cell carcinoma of skin of right upper limb, including shoulder: Secondary | ICD-10-CM | POA: Diagnosis not present

## 2016-07-05 DIAGNOSIS — D235 Other benign neoplasm of skin of trunk: Secondary | ICD-10-CM | POA: Diagnosis not present

## 2016-07-24 ENCOUNTER — Other Ambulatory Visit: Payer: Self-pay

## 2016-07-24 DIAGNOSIS — I2510 Atherosclerotic heart disease of native coronary artery without angina pectoris: Secondary | ICD-10-CM

## 2016-07-24 DIAGNOSIS — I10 Essential (primary) hypertension: Secondary | ICD-10-CM

## 2016-07-24 MED ORDER — CARVEDILOL 3.125 MG PO TABS: 3.1250 mg | ORAL_TABLET | Freq: Two times a day (BID) | ORAL | 1 refills | Status: AC

## 2016-08-02 ENCOUNTER — Other Ambulatory Visit: Payer: Self-pay | Admitting: Family Medicine

## 2016-08-02 DIAGNOSIS — G2581 Restless legs syndrome: Secondary | ICD-10-CM

## 2016-08-14 ENCOUNTER — Ambulatory Visit: Payer: Medicare Other | Admitting: Neurology

## 2016-08-20 DIAGNOSIS — E782 Mixed hyperlipidemia: Secondary | ICD-10-CM | POA: Diagnosis not present

## 2016-08-20 DIAGNOSIS — C4492 Squamous cell carcinoma of skin, unspecified: Secondary | ICD-10-CM | POA: Diagnosis not present

## 2016-08-20 DIAGNOSIS — M545 Low back pain: Secondary | ICD-10-CM | POA: Diagnosis not present

## 2016-08-20 DIAGNOSIS — I10 Essential (primary) hypertension: Secondary | ICD-10-CM | POA: Diagnosis not present

## 2016-08-20 DIAGNOSIS — I25119 Atherosclerotic heart disease of native coronary artery with unspecified angina pectoris: Secondary | ICD-10-CM | POA: Diagnosis not present

## 2016-08-28 DIAGNOSIS — I25119 Atherosclerotic heart disease of native coronary artery with unspecified angina pectoris: Secondary | ICD-10-CM | POA: Diagnosis not present

## 2016-08-28 DIAGNOSIS — Z720 Tobacco use: Secondary | ICD-10-CM | POA: Diagnosis not present

## 2016-08-28 DIAGNOSIS — E782 Mixed hyperlipidemia: Secondary | ICD-10-CM | POA: Diagnosis not present

## 2016-08-28 DIAGNOSIS — I739 Peripheral vascular disease, unspecified: Secondary | ICD-10-CM | POA: Diagnosis not present

## 2016-08-28 DIAGNOSIS — I10 Essential (primary) hypertension: Secondary | ICD-10-CM | POA: Diagnosis not present

## 2016-09-20 DIAGNOSIS — I25119 Atherosclerotic heart disease of native coronary artery with unspecified angina pectoris: Secondary | ICD-10-CM | POA: Diagnosis not present

## 2016-09-20 DIAGNOSIS — Z23 Encounter for immunization: Secondary | ICD-10-CM | POA: Diagnosis not present

## 2016-09-20 DIAGNOSIS — I10 Essential (primary) hypertension: Secondary | ICD-10-CM | POA: Diagnosis not present

## 2016-09-20 DIAGNOSIS — M069 Rheumatoid arthritis, unspecified: Secondary | ICD-10-CM | POA: Diagnosis not present

## 2016-10-01 ENCOUNTER — Ambulatory Visit: Payer: Medicare Other | Admitting: Family Medicine

## 2016-11-22 ENCOUNTER — Telehealth: Payer: Self-pay | Admitting: *Deleted

## 2016-11-22 DIAGNOSIS — E782 Mixed hyperlipidemia: Secondary | ICD-10-CM

## 2016-11-22 MED ORDER — ROSUVASTATIN CALCIUM 40 MG PO TABS: 40.0000 mg | ORAL_TABLET | Freq: Every day | ORAL | 5 refills | Status: AC

## 2016-11-22 NOTE — Telephone Encounter (Signed)
-----   Message from Lonn Georgia, PA-C sent at 11/21/2016  4:57 PM EDT ----- Yes, thanks  ----- Message ----- From: Earvin Hansen Sent: 11/16/2016   6:11 PM To: Lonn Georgia, PA-C  Hi Rhonda Is it ok to give Mr Esco Joslyn, received refill request from [facility] Thanks Rip Harbour

## 2016-11-22 NOTE — Telephone Encounter (Signed)
Refilled as requested  

## 2016-11-26 DIAGNOSIS — E782 Mixed hyperlipidemia: Secondary | ICD-10-CM | POA: Diagnosis not present

## 2016-11-26 DIAGNOSIS — I10 Essential (primary) hypertension: Secondary | ICD-10-CM | POA: Diagnosis not present

## 2016-12-18 DIAGNOSIS — I25119 Atherosclerotic heart disease of native coronary artery with unspecified angina pectoris: Secondary | ICD-10-CM | POA: Diagnosis not present

## 2016-12-18 DIAGNOSIS — C44622 Squamous cell carcinoma of skin of right upper limb, including shoulder: Secondary | ICD-10-CM | POA: Diagnosis not present

## 2016-12-18 DIAGNOSIS — L570 Actinic keratosis: Secondary | ICD-10-CM | POA: Diagnosis not present

## 2016-12-18 DIAGNOSIS — I10 Essential (primary) hypertension: Secondary | ICD-10-CM | POA: Diagnosis not present

## 2016-12-20 ENCOUNTER — Encounter: Payer: Self-pay | Admitting: Family Medicine

## 2016-12-24 DIAGNOSIS — D485 Neoplasm of uncertain behavior of skin: Secondary | ICD-10-CM | POA: Diagnosis not present

## 2017-01-22 DIAGNOSIS — Z0000 Encounter for general adult medical examination without abnormal findings: Secondary | ICD-10-CM | POA: Diagnosis not present

## 2017-01-22 DIAGNOSIS — E782 Mixed hyperlipidemia: Secondary | ICD-10-CM | POA: Diagnosis not present

## 2017-01-22 DIAGNOSIS — Z7901 Long term (current) use of anticoagulants: Secondary | ICD-10-CM | POA: Diagnosis not present

## 2017-01-22 DIAGNOSIS — I25119 Atherosclerotic heart disease of native coronary artery with unspecified angina pectoris: Secondary | ICD-10-CM | POA: Diagnosis not present

## 2017-01-22 DIAGNOSIS — I10 Essential (primary) hypertension: Secondary | ICD-10-CM | POA: Diagnosis not present

## 2017-02-16 ENCOUNTER — Other Ambulatory Visit: Payer: Self-pay | Admitting: Physician Assistant

## 2017-02-28 DIAGNOSIS — I739 Peripheral vascular disease, unspecified: Secondary | ICD-10-CM | POA: Diagnosis not present

## 2017-02-28 DIAGNOSIS — E782 Mixed hyperlipidemia: Secondary | ICD-10-CM | POA: Diagnosis not present

## 2017-02-28 DIAGNOSIS — I25119 Atherosclerotic heart disease of native coronary artery with unspecified angina pectoris: Secondary | ICD-10-CM | POA: Diagnosis not present

## 2017-02-28 DIAGNOSIS — I10 Essential (primary) hypertension: Secondary | ICD-10-CM | POA: Diagnosis not present

## 2017-02-28 DIAGNOSIS — I25118 Atherosclerotic heart disease of native coronary artery with other forms of angina pectoris: Secondary | ICD-10-CM | POA: Diagnosis not present

## 2017-05-06 ENCOUNTER — Encounter: Payer: Self-pay | Admitting: Physician Assistant

## 2017-05-09 IMAGING — CT CT HEAD W/O CM
4 series · 15 of 47 positions shown, 17 images · non-contrast
Comparison: None.

CLINICAL DATA: Acute onset of generalized weakness and
hypercholesterolemia. Initial encounter.

EXAM:
CT HEAD WITHOUT CONTRAST
TECHNIQUE: Contiguous axial images were obtained from the base of the skull
through the vertex without intravenous contrast.

[Series 2: head without · axial · non-contrast · 0.45mm/px · z∈[-145,-30]mm · 7 of 31 slices shown, 9 images]
[im 4/31  brain]
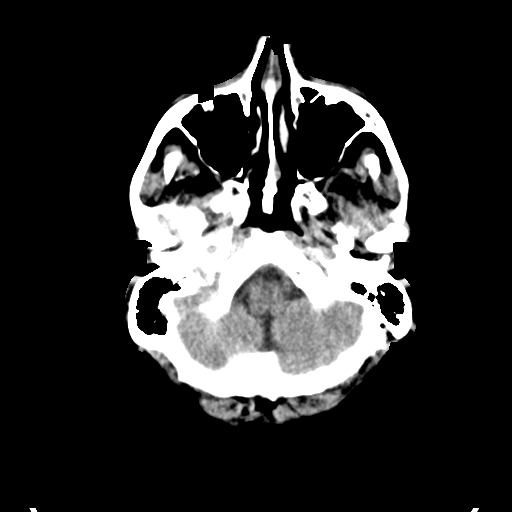
[im 4/31  bone]
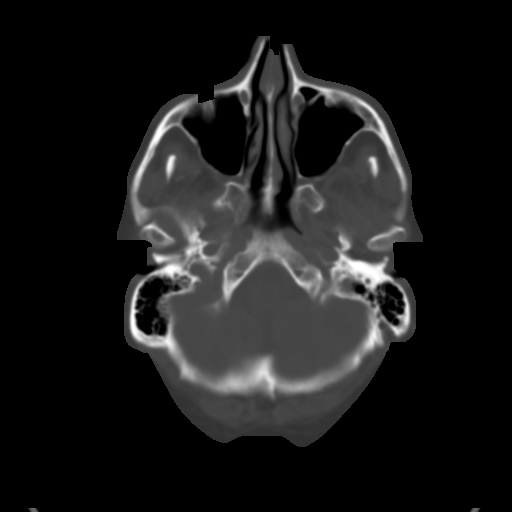
[im 8/31  brain]
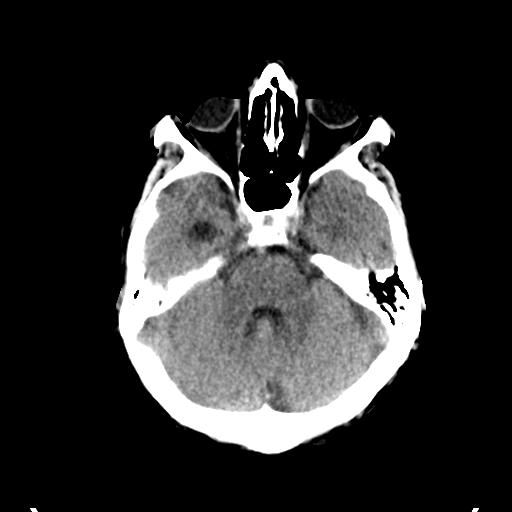
[im 12/31  brain]
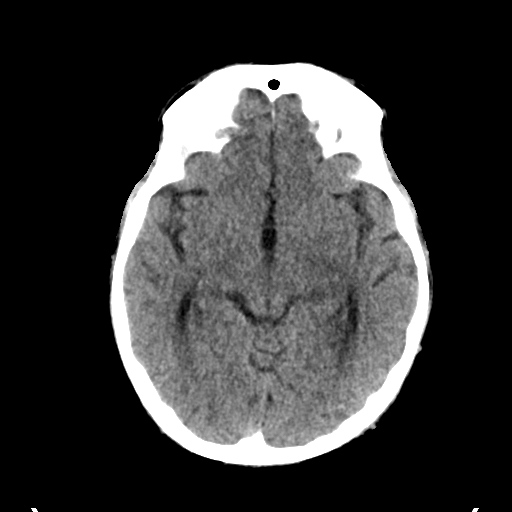
[im 16/31  brain]
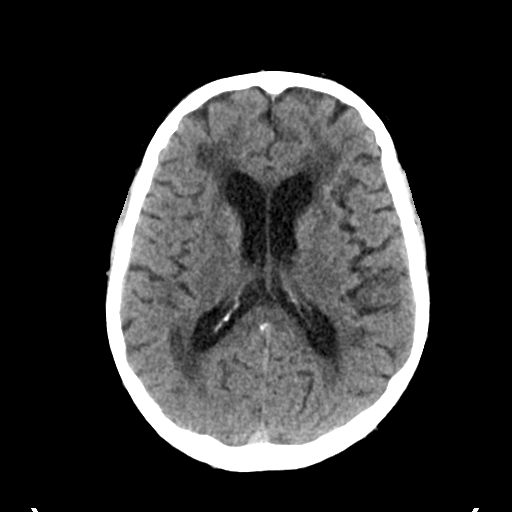
[im 19/31  brain]
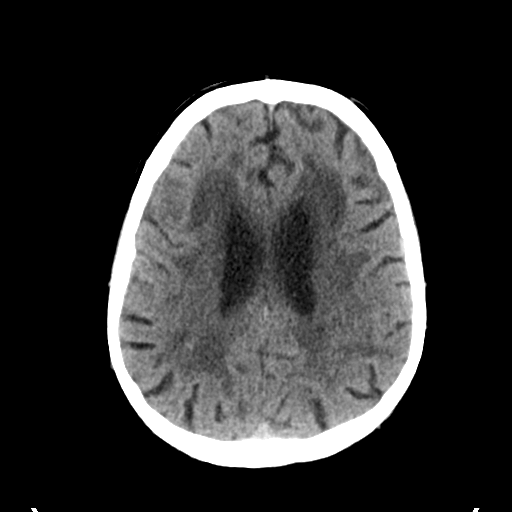
[im 19/31  bone]
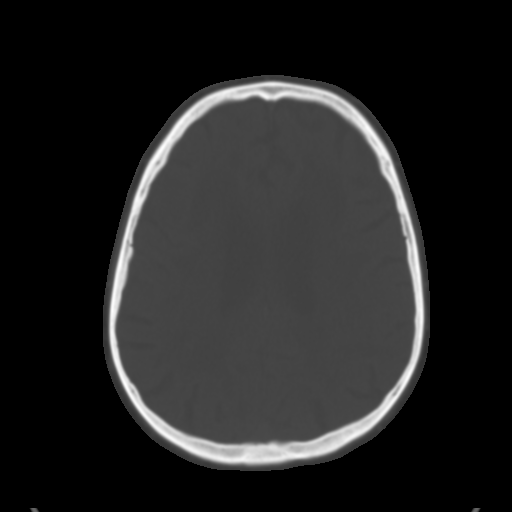
[im 23/31  brain]
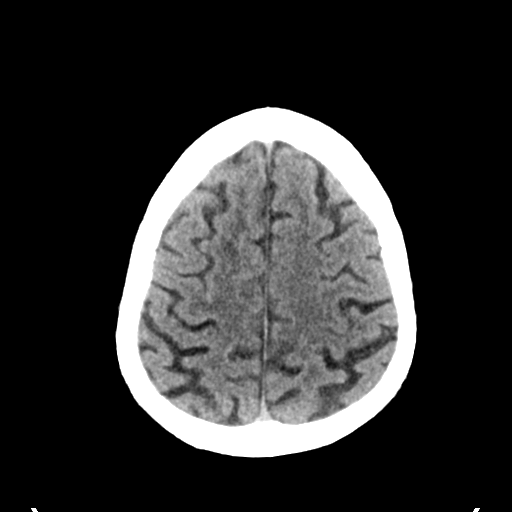
[im 27/31  brain]
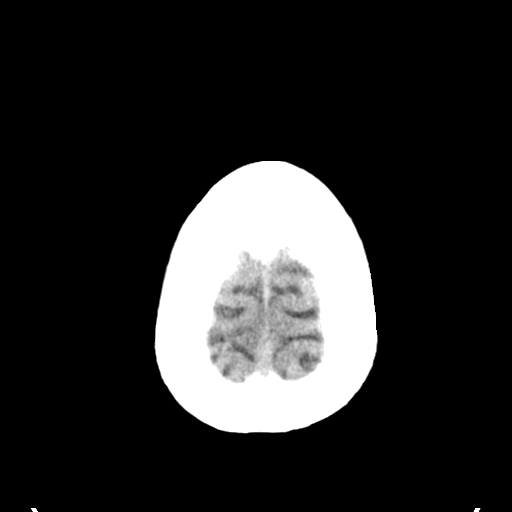

[Series 3: head bone · axial · 0.45mm/px · z∈[-146,-130]mm · 2 of 76 slices shown]
[im 8/76  bone]
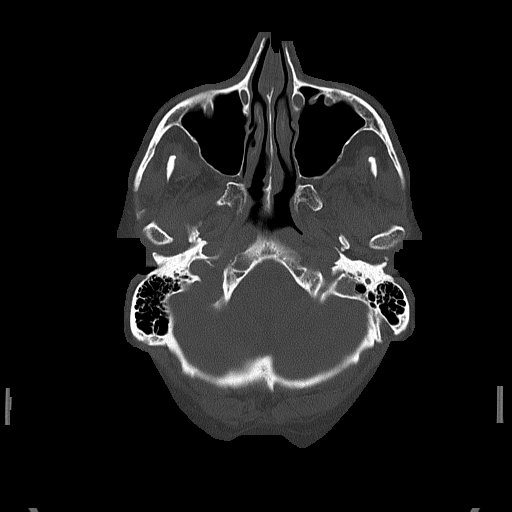
[im 16/76  bone]
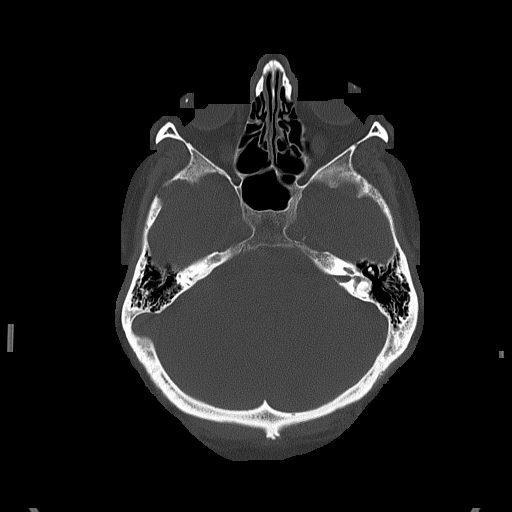

[Series 4: head without cor · coronal · non-contrast · 0.34mm/px · 3 of 60 slices shown]
[im 20/60  brain]
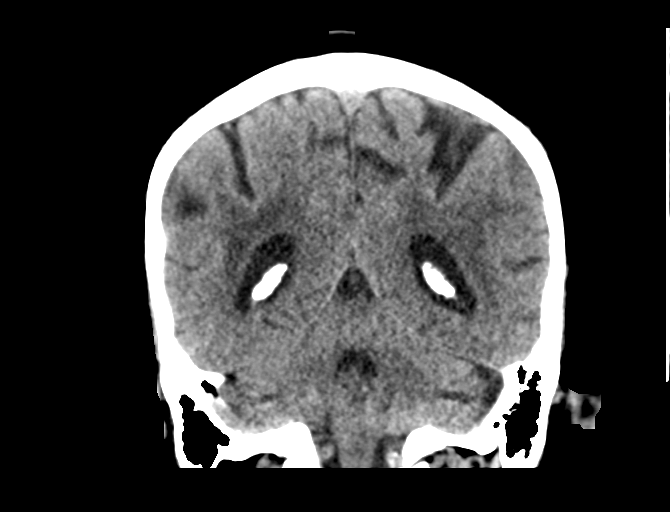
[im 27/60  brain]
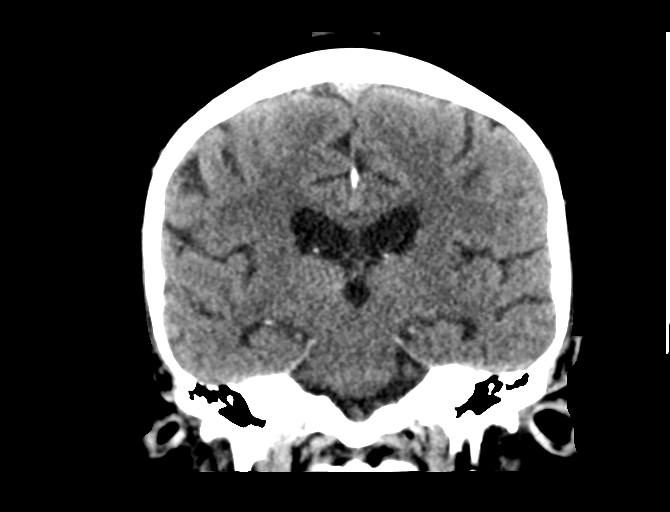
[im 33/60  brain]
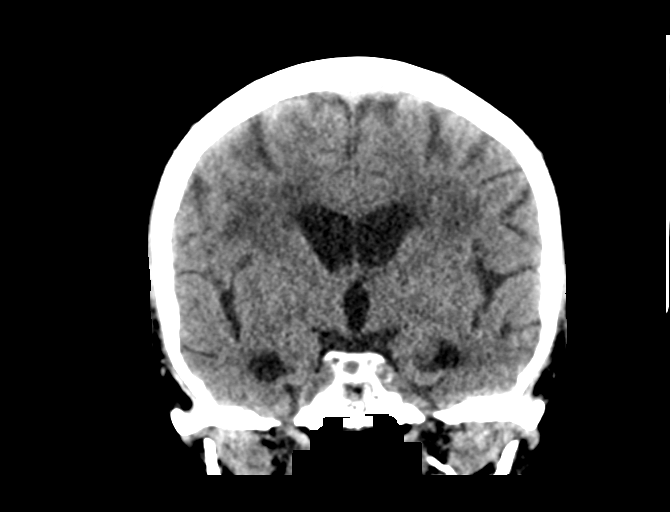

[Series 5: head without sag · sagittal · non-contrast · 0.33mm/px · 3 of 48 slices shown]
[im 16/48  brain]
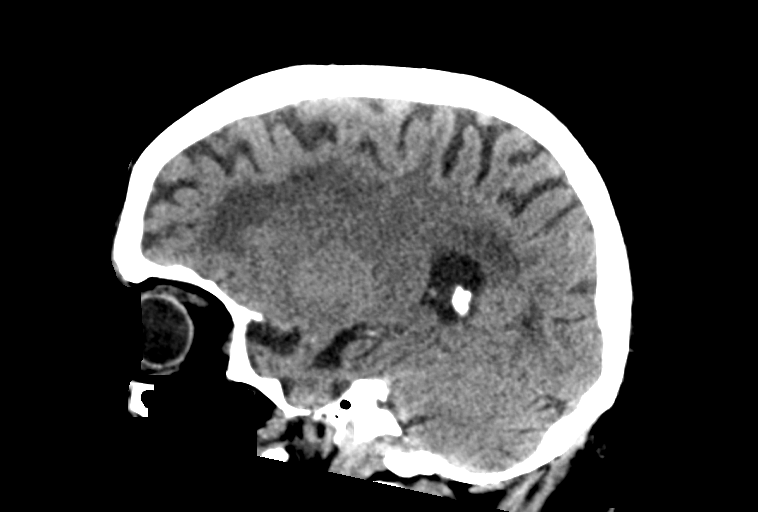
[im 24/48  brain]
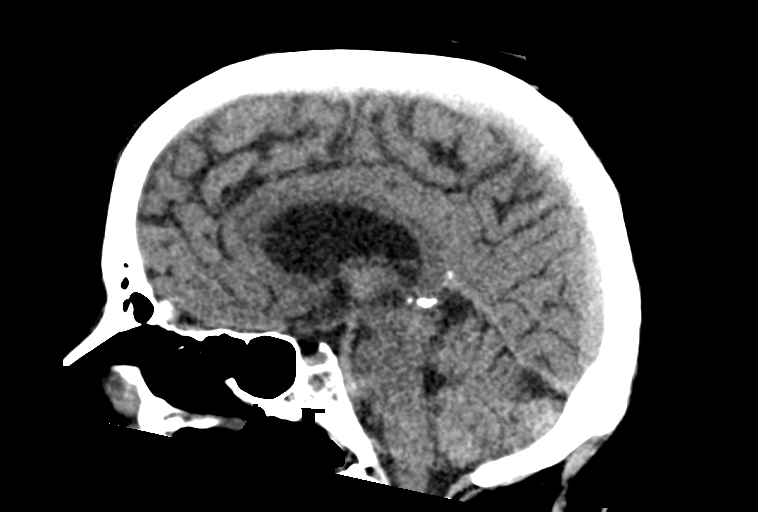
[im 32/48  brain]
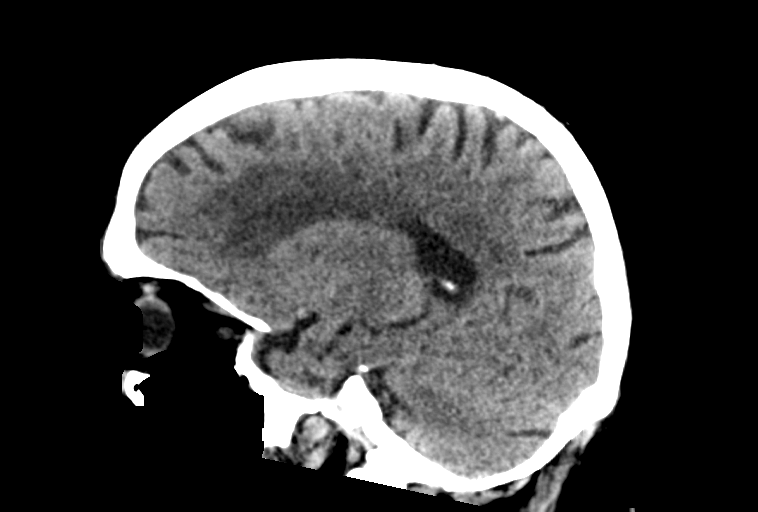

[15 of 47 positions shown; findings below may reference images not displayed]

FINDINGS: Brain: No evidence of acute infarction, hemorrhage, hydrocephalus,
extra-axial collection or mass lesion/mass effect.

Prominence of the ventricles and sulci suggests mild cortical volume
loss. Diffuse periventricular and subcortical white matter change
likely reflects small vessel ischemic microangiopathy.

The brainstem and fourth ventricle are within normal limits. The
basal ganglia are unremarkable in appearance. The cerebral
hemispheres demonstrate grossly normal gray-white differentiation.
No mass effect or midline shift is seen.

Vascular: No hyperdense vessel or unexpected calcification.

Skull: The liver is unremarkable in appearance. The gallbladder is
unremarkable in appearance. The common bile duct remains normal in
caliber.

Sinuses/Orbits: The orbits are within normal limits. The paranasal
sinuses and mastoid air cells are well-aerated.

Other: No significant soft tissue abnormalities are seen.
IMPRESSION: 1. No acute intracranial pathology seen on CT.
2. Mild cortical volume loss and diffuse small vessel ischemic
microangiopathy.

## 2017-05-10 ENCOUNTER — Other Ambulatory Visit: Payer: Self-pay | Admitting: *Deleted

## 2017-05-10 DIAGNOSIS — G2581 Restless legs syndrome: Secondary | ICD-10-CM

## 2017-05-10 MED ORDER — ROPINIROLE HCL 0.25 MG PO TABS: 0.2500 mg | ORAL_TABLET | Freq: Every day | ORAL | 1 refills | Status: AC

## 2017-06-06 DIAGNOSIS — C44622 Squamous cell carcinoma of skin of right upper limb, including shoulder: Secondary | ICD-10-CM | POA: Diagnosis not present

## 2017-06-11 DIAGNOSIS — C44622 Squamous cell carcinoma of skin of right upper limb, including shoulder: Secondary | ICD-10-CM | POA: Diagnosis not present

## 2017-06-11 DIAGNOSIS — L905 Scar conditions and fibrosis of skin: Secondary | ICD-10-CM | POA: Diagnosis not present

## 2017-06-17 DIAGNOSIS — Z4802 Encounter for removal of sutures: Secondary | ICD-10-CM | POA: Diagnosis not present

## 2017-09-10 DIAGNOSIS — I10 Essential (primary) hypertension: Secondary | ICD-10-CM | POA: Diagnosis not present

## 2017-09-10 DIAGNOSIS — I25119 Atherosclerotic heart disease of native coronary artery with unspecified angina pectoris: Secondary | ICD-10-CM | POA: Diagnosis not present

## 2017-09-10 DIAGNOSIS — F17200 Nicotine dependence, unspecified, uncomplicated: Secondary | ICD-10-CM | POA: Diagnosis not present

## 2017-09-10 DIAGNOSIS — Z955 Presence of coronary angioplasty implant and graft: Secondary | ICD-10-CM | POA: Diagnosis not present

## 2017-09-10 DIAGNOSIS — I252 Old myocardial infarction: Secondary | ICD-10-CM | POA: Diagnosis not present

## 2017-09-12 DIAGNOSIS — I517 Cardiomegaly: Secondary | ICD-10-CM | POA: Diagnosis not present

## 2017-09-12 DIAGNOSIS — I371 Nonrheumatic pulmonary valve insufficiency: Secondary | ICD-10-CM | POA: Diagnosis not present

## 2017-11-07 ENCOUNTER — Other Ambulatory Visit: Payer: Self-pay | Admitting: Family Medicine

## 2017-11-07 DIAGNOSIS — G2581 Restless legs syndrome: Secondary | ICD-10-CM

## 2017-11-11 ENCOUNTER — Other Ambulatory Visit: Payer: Self-pay | Admitting: Family Medicine

## 2017-11-11 DIAGNOSIS — G2581 Restless legs syndrome: Secondary | ICD-10-CM

## 2017-11-25 DIAGNOSIS — C44629 Squamous cell carcinoma of skin of left upper limb, including shoulder: Secondary | ICD-10-CM | POA: Diagnosis not present

## 2017-11-25 DIAGNOSIS — D485 Neoplasm of uncertain behavior of skin: Secondary | ICD-10-CM | POA: Diagnosis not present

## 2017-11-28 ENCOUNTER — Other Ambulatory Visit: Payer: Self-pay | Admitting: Family Medicine

## 2017-11-28 DIAGNOSIS — G2581 Restless legs syndrome: Secondary | ICD-10-CM

## 2017-12-04 DIAGNOSIS — I10 Essential (primary) hypertension: Secondary | ICD-10-CM | POA: Diagnosis not present

## 2017-12-04 DIAGNOSIS — F17200 Nicotine dependence, unspecified, uncomplicated: Secondary | ICD-10-CM | POA: Diagnosis not present

## 2017-12-04 DIAGNOSIS — E785 Hyperlipidemia, unspecified: Secondary | ICD-10-CM | POA: Diagnosis not present

## 2017-12-04 DIAGNOSIS — I2510 Atherosclerotic heart disease of native coronary artery without angina pectoris: Secondary | ICD-10-CM | POA: Diagnosis not present

## 2017-12-16 DIAGNOSIS — C4492 Squamous cell carcinoma of skin, unspecified: Secondary | ICD-10-CM | POA: Diagnosis not present

## 2018-01-01 DIAGNOSIS — I25119 Atherosclerotic heart disease of native coronary artery with unspecified angina pectoris: Secondary | ICD-10-CM | POA: Diagnosis not present

## 2018-01-01 DIAGNOSIS — F17200 Nicotine dependence, unspecified, uncomplicated: Secondary | ICD-10-CM | POA: Diagnosis not present

## 2018-01-01 DIAGNOSIS — Z955 Presence of coronary angioplasty implant and graft: Secondary | ICD-10-CM | POA: Diagnosis not present

## 2018-01-01 DIAGNOSIS — I351 Nonrheumatic aortic (valve) insufficiency: Secondary | ICD-10-CM | POA: Diagnosis not present

## 2018-01-01 DIAGNOSIS — I10 Essential (primary) hypertension: Secondary | ICD-10-CM | POA: Diagnosis not present

## 2018-01-01 DIAGNOSIS — I252 Old myocardial infarction: Secondary | ICD-10-CM | POA: Diagnosis not present

## 2018-01-01 DIAGNOSIS — E782 Mixed hyperlipidemia: Secondary | ICD-10-CM | POA: Diagnosis not present

## 2018-01-22 DIAGNOSIS — Z23 Encounter for immunization: Secondary | ICD-10-CM | POA: Diagnosis not present

## 2018-02-13 DIAGNOSIS — R05 Cough: Secondary | ICD-10-CM | POA: Diagnosis not present

## 2018-02-13 DIAGNOSIS — F17200 Nicotine dependence, unspecified, uncomplicated: Secondary | ICD-10-CM | POA: Diagnosis not present

## 2018-02-13 DIAGNOSIS — I2510 Atherosclerotic heart disease of native coronary artery without angina pectoris: Secondary | ICD-10-CM | POA: Diagnosis not present

## 2018-02-13 DIAGNOSIS — N39498 Other specified urinary incontinence: Secondary | ICD-10-CM | POA: Diagnosis not present

## 2018-02-13 DIAGNOSIS — G4700 Insomnia, unspecified: Secondary | ICD-10-CM | POA: Diagnosis not present

## 2018-02-13 DIAGNOSIS — I10 Essential (primary) hypertension: Secondary | ICD-10-CM | POA: Diagnosis not present

## 2018-03-07 DIAGNOSIS — F329 Major depressive disorder, single episode, unspecified: Secondary | ICD-10-CM | POA: Diagnosis not present

## 2018-03-07 DIAGNOSIS — E785 Hyperlipidemia, unspecified: Secondary | ICD-10-CM | POA: Diagnosis not present

## 2018-03-07 DIAGNOSIS — I10 Essential (primary) hypertension: Secondary | ICD-10-CM | POA: Diagnosis not present

## 2018-03-07 DIAGNOSIS — D519 Vitamin B12 deficiency anemia, unspecified: Secondary | ICD-10-CM | POA: Diagnosis not present

## 2018-11-03 ENCOUNTER — Telehealth: Payer: Self-pay | Admitting: Cardiovascular Disease

## 2018-11-03 NOTE — Telephone Encounter (Signed)
Tried to call for recall - no vm
